# Patient Record
Sex: Female | Born: 1940 | Race: White | Hispanic: No | Smoking: Current every day smoker
Health system: Southern US, Community
[De-identification: ages and names within clinical notes are randomized; demographics above are authoritative.]

## PROBLEM LIST (undated history)

## (undated) DIAGNOSIS — K760 Fatty (change of) liver, not elsewhere classified: Secondary | ICD-10-CM

## (undated) DIAGNOSIS — E7800 Pure hypercholesterolemia, unspecified: Secondary | ICD-10-CM

## (undated) DIAGNOSIS — I714 Abdominal aortic aneurysm, without rupture, unspecified: Secondary | ICD-10-CM

## (undated) DIAGNOSIS — K922 Gastrointestinal hemorrhage, unspecified: Secondary | ICD-10-CM

## (undated) DIAGNOSIS — F32A Depression, unspecified: Secondary | ICD-10-CM

## (undated) DIAGNOSIS — M1990 Unspecified osteoarthritis, unspecified site: Secondary | ICD-10-CM

## (undated) DIAGNOSIS — J449 Chronic obstructive pulmonary disease, unspecified: Secondary | ICD-10-CM

## (undated) DIAGNOSIS — I639 Cerebral infarction, unspecified: Secondary | ICD-10-CM

## (undated) DIAGNOSIS — G463 Brain stem stroke syndrome: Secondary | ICD-10-CM

## (undated) DIAGNOSIS — K649 Unspecified hemorrhoids: Secondary | ICD-10-CM

## (undated) DIAGNOSIS — D649 Anemia, unspecified: Secondary | ICD-10-CM

## (undated) DIAGNOSIS — I10 Essential (primary) hypertension: Secondary | ICD-10-CM

## (undated) DIAGNOSIS — F329 Major depressive disorder, single episode, unspecified: Secondary | ICD-10-CM

## (undated) DIAGNOSIS — K5790 Diverticulosis of intestine, part unspecified, without perforation or abscess without bleeding: Secondary | ICD-10-CM

## (undated) HISTORY — DX: Gastrointestinal hemorrhage, unspecified: K92.2

## (undated) HISTORY — DX: Fatty (change of) liver, not elsewhere classified: K76.0

## (undated) HISTORY — DX: Brain stem stroke syndrome: G46.3

## (undated) HISTORY — PX: CATARACT EXTRACTION, BILATERAL: SHX1313

## (undated) HISTORY — DX: Unspecified hemorrhoids: K64.9

## (undated) HISTORY — DX: Diverticulosis of intestine, part unspecified, without perforation or abscess without bleeding: K57.90

## (undated) HISTORY — DX: Depression, unspecified: F32.A

## (undated) HISTORY — PX: ABDOMINAL HYSTERECTOMY: SHX81

## (undated) HISTORY — DX: Chronic obstructive pulmonary disease, unspecified: J44.9

## (undated) HISTORY — DX: Anemia, unspecified: D64.9

## (undated) HISTORY — DX: Major depressive disorder, single episode, unspecified: F32.9

## (undated) HISTORY — PX: EYE SURGERY: SHX253

## (undated) HISTORY — PX: SHOULDER SURGERY: SHX246

## (undated) HISTORY — PX: KNEE SURGERY: SHX244

## (undated) HISTORY — PX: LAMINECTOMY: SHX219

## (undated) HISTORY — DX: Unspecified osteoarthritis, unspecified site: M19.90

---

## 1998-12-09 ENCOUNTER — Other Ambulatory Visit: Admission: RE | Admit: 1998-12-09 | Discharge: 1998-12-09 | Payer: Self-pay | Admitting: *Deleted

## 1998-12-26 ENCOUNTER — Encounter: Admission: RE | Admit: 1998-12-26 | Discharge: 1998-12-26 | Payer: Self-pay | Admitting: *Deleted

## 1998-12-26 ENCOUNTER — Encounter: Payer: Self-pay | Admitting: *Deleted

## 1999-01-29 ENCOUNTER — Ambulatory Visit (HOSPITAL_COMMUNITY): Admission: RE | Admit: 1999-01-29 | Discharge: 1999-01-29 | Payer: Self-pay | Admitting: *Deleted

## 1999-02-18 ENCOUNTER — Encounter: Payer: Self-pay | Admitting: Otolaryngology

## 1999-02-18 ENCOUNTER — Ambulatory Visit (HOSPITAL_COMMUNITY): Admission: RE | Admit: 1999-02-18 | Discharge: 1999-02-18 | Payer: Self-pay | Admitting: Otolaryngology

## 1999-02-18 ENCOUNTER — Encounter (INDEPENDENT_AMBULATORY_CARE_PROVIDER_SITE_OTHER): Payer: Self-pay | Admitting: Specialist

## 1999-02-21 ENCOUNTER — Emergency Department (HOSPITAL_COMMUNITY): Admission: EM | Admit: 1999-02-21 | Discharge: 1999-02-21 | Payer: Self-pay | Admitting: Emergency Medicine

## 1999-03-03 ENCOUNTER — Ambulatory Visit (HOSPITAL_COMMUNITY): Admission: RE | Admit: 1999-03-03 | Discharge: 1999-03-03 | Payer: Self-pay | Admitting: Cardiology

## 1999-03-06 ENCOUNTER — Inpatient Hospital Stay (HOSPITAL_COMMUNITY): Admission: AD | Admit: 1999-03-06 | Discharge: 1999-03-07 | Payer: Self-pay | Admitting: Cardiology

## 1999-03-11 ENCOUNTER — Ambulatory Visit: Admission: RE | Admit: 1999-03-11 | Discharge: 1999-03-11 | Payer: Self-pay | Admitting: *Deleted

## 1999-04-04 ENCOUNTER — Encounter: Payer: Self-pay | Admitting: Neurology

## 1999-04-04 ENCOUNTER — Encounter: Admission: RE | Admit: 1999-04-04 | Discharge: 1999-04-04 | Payer: Self-pay | Admitting: Psychiatry

## 1999-10-15 ENCOUNTER — Emergency Department (HOSPITAL_COMMUNITY): Admission: EM | Admit: 1999-10-15 | Discharge: 1999-10-15 | Payer: Self-pay | Admitting: Emergency Medicine

## 1999-12-08 ENCOUNTER — Encounter: Payer: Self-pay | Admitting: Cardiology

## 1999-12-08 ENCOUNTER — Ambulatory Visit (HOSPITAL_COMMUNITY): Admission: RE | Admit: 1999-12-08 | Discharge: 1999-12-08 | Payer: Self-pay | Admitting: Cardiology

## 2000-01-02 ENCOUNTER — Encounter: Payer: Self-pay | Admitting: Orthopedic Surgery

## 2000-01-02 ENCOUNTER — Ambulatory Visit (HOSPITAL_COMMUNITY): Admission: RE | Admit: 2000-01-02 | Discharge: 2000-01-02 | Payer: Self-pay | Admitting: Orthopedic Surgery

## 2000-05-17 ENCOUNTER — Encounter: Admission: RE | Admit: 2000-05-17 | Discharge: 2000-08-15 | Payer: Self-pay | Admitting: Anesthesiology

## 2000-09-13 ENCOUNTER — Emergency Department (HOSPITAL_COMMUNITY): Admission: EM | Admit: 2000-09-13 | Discharge: 2000-09-13 | Payer: Self-pay

## 2002-07-11 ENCOUNTER — Encounter: Payer: Self-pay | Admitting: Family Medicine

## 2002-07-11 ENCOUNTER — Ambulatory Visit (HOSPITAL_COMMUNITY): Admission: RE | Admit: 2002-07-11 | Discharge: 2002-07-11 | Payer: Self-pay | Admitting: Family Medicine

## 2002-12-20 ENCOUNTER — Inpatient Hospital Stay (HOSPITAL_COMMUNITY): Admission: EM | Admit: 2002-12-20 | Discharge: 2002-12-29 | Payer: Self-pay

## 2004-08-02 ENCOUNTER — Emergency Department (HOSPITAL_COMMUNITY): Admission: EM | Admit: 2004-08-02 | Discharge: 2004-08-02 | Payer: Self-pay | Admitting: Emergency Medicine

## 2004-09-29 ENCOUNTER — Emergency Department (HOSPITAL_COMMUNITY): Admission: EM | Admit: 2004-09-29 | Discharge: 2004-09-29 | Payer: Self-pay | Admitting: Emergency Medicine

## 2004-12-29 ENCOUNTER — Ambulatory Visit (HOSPITAL_COMMUNITY): Admission: RE | Admit: 2004-12-29 | Discharge: 2004-12-29 | Payer: Self-pay | Admitting: Internal Medicine

## 2005-05-22 ENCOUNTER — Inpatient Hospital Stay (HOSPITAL_COMMUNITY): Admission: EM | Admit: 2005-05-22 | Discharge: 2005-05-25 | Payer: Self-pay | Admitting: Emergency Medicine

## 2005-05-29 ENCOUNTER — Ambulatory Visit: Payer: Self-pay | Admitting: Gastroenterology

## 2005-06-05 ENCOUNTER — Inpatient Hospital Stay (HOSPITAL_COMMUNITY): Admission: EM | Admit: 2005-06-05 | Discharge: 2005-06-08 | Payer: Self-pay | Admitting: Emergency Medicine

## 2005-12-15 ENCOUNTER — Inpatient Hospital Stay (HOSPITAL_COMMUNITY): Admission: EM | Admit: 2005-12-15 | Discharge: 2005-12-18 | Payer: Self-pay | Admitting: Emergency Medicine

## 2005-12-21 ENCOUNTER — Ambulatory Visit: Payer: Self-pay | Admitting: Gastroenterology

## 2005-12-25 ENCOUNTER — Emergency Department (HOSPITAL_COMMUNITY): Admission: EM | Admit: 2005-12-25 | Discharge: 2005-12-25 | Payer: Self-pay | Admitting: Emergency Medicine

## 2006-01-07 ENCOUNTER — Inpatient Hospital Stay (HOSPITAL_COMMUNITY): Admission: EM | Admit: 2006-01-07 | Discharge: 2006-01-11 | Payer: Self-pay | Admitting: Emergency Medicine

## 2006-01-15 ENCOUNTER — Inpatient Hospital Stay (HOSPITAL_COMMUNITY): Admission: EM | Admit: 2006-01-15 | Discharge: 2006-01-20 | Payer: Self-pay | Admitting: Emergency Medicine

## 2006-01-21 ENCOUNTER — Ambulatory Visit (HOSPITAL_COMMUNITY): Admission: RE | Admit: 2006-01-21 | Discharge: 2006-01-22 | Payer: Self-pay | Admitting: Orthopedic Surgery

## 2006-02-19 ENCOUNTER — Encounter: Admission: RE | Admit: 2006-02-19 | Discharge: 2006-02-19 | Payer: Self-pay | Admitting: Orthopedic Surgery

## 2006-03-01 ENCOUNTER — Ambulatory Visit (HOSPITAL_COMMUNITY): Admission: RE | Admit: 2006-03-01 | Discharge: 2006-03-02 | Payer: Self-pay | Admitting: Orthopedic Surgery

## 2006-06-11 ENCOUNTER — Ambulatory Visit (HOSPITAL_COMMUNITY): Admission: RE | Admit: 2006-06-11 | Discharge: 2006-06-11 | Payer: Self-pay | Admitting: Orthopedic Surgery

## 2006-07-12 ENCOUNTER — Ambulatory Visit (HOSPITAL_BASED_OUTPATIENT_CLINIC_OR_DEPARTMENT_OTHER): Admission: RE | Admit: 2006-07-12 | Discharge: 2006-07-13 | Payer: Self-pay | Admitting: Orthopedic Surgery

## 2006-11-22 ENCOUNTER — Emergency Department (HOSPITAL_COMMUNITY): Admission: EM | Admit: 2006-11-22 | Discharge: 2006-11-22 | Payer: Self-pay | Admitting: *Deleted

## 2006-11-27 ENCOUNTER — Inpatient Hospital Stay (HOSPITAL_COMMUNITY): Admission: EM | Admit: 2006-11-27 | Discharge: 2006-11-29 | Payer: Self-pay | Admitting: Emergency Medicine

## 2006-11-29 ENCOUNTER — Ambulatory Visit: Payer: Self-pay | Admitting: *Deleted

## 2006-11-29 ENCOUNTER — Encounter (INDEPENDENT_AMBULATORY_CARE_PROVIDER_SITE_OTHER): Payer: Self-pay | Admitting: Internal Medicine

## 2007-03-22 ENCOUNTER — Ambulatory Visit: Payer: Self-pay | Admitting: Gastroenterology

## 2007-06-01 IMAGING — CT CT 3D INDEPENDENT WKST
3 of 7 series · 10 of 33 positions shown, 12 images · IV contrast (agent unspecified)
Comparison: Plain radiographs 12/25/05.

CLINICAL DATA: Greater tuberosity fracture.   History of shoulder dislocation. 
CT OF THE LEFT WITHOUT CONTRAST:
TECHNIQUE: Multidetector CT imaging was performed according to the standard protocol.  No intravenous contrast was administered.  Multiplanar CT image reconstructions were also generated.

[Series 4: upper ext · axial · 0.47mm/px · z∈[-150,-90]mm · 2 of 72 slices shown, 3 images]
[im 24/72  soft-tissue]
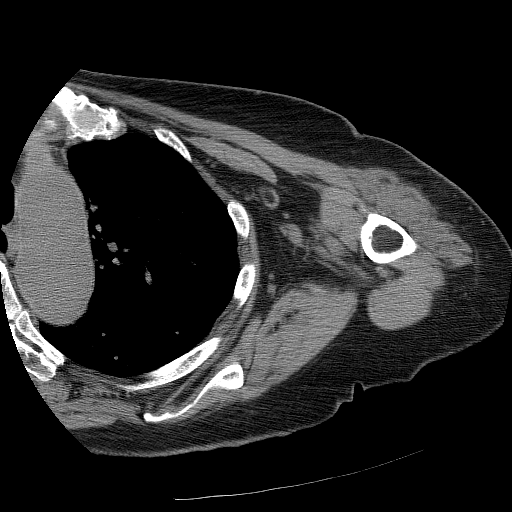
[im 24/72  bone]
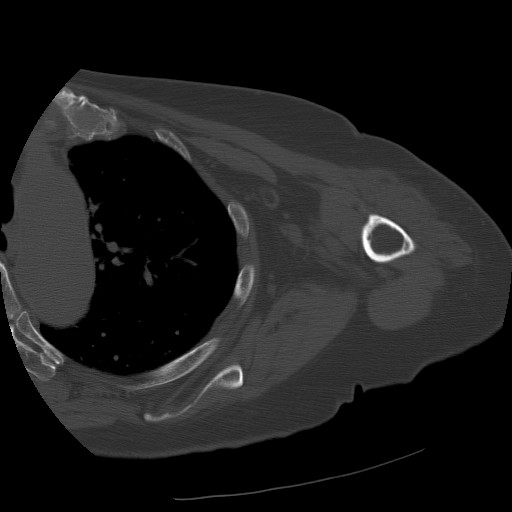
[im 48/72  bone]
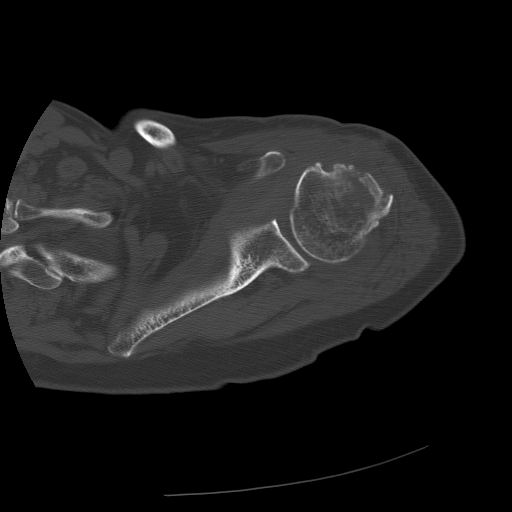

[Series 200: cor lt shoulder · coronal · 0.47mm/px · 5 of 73 slices shown, 6 images]
[im 25/73  bone]
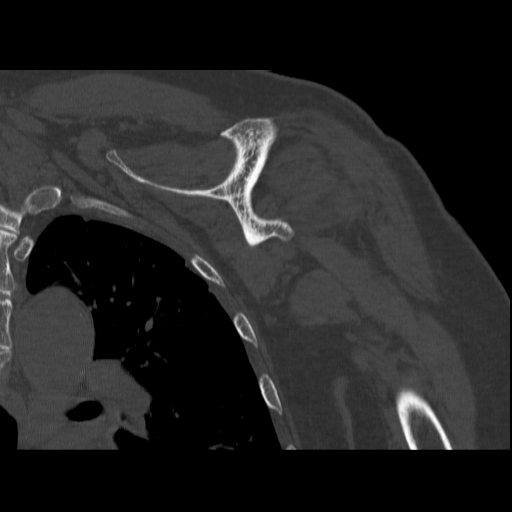
[im 31/73  bone]
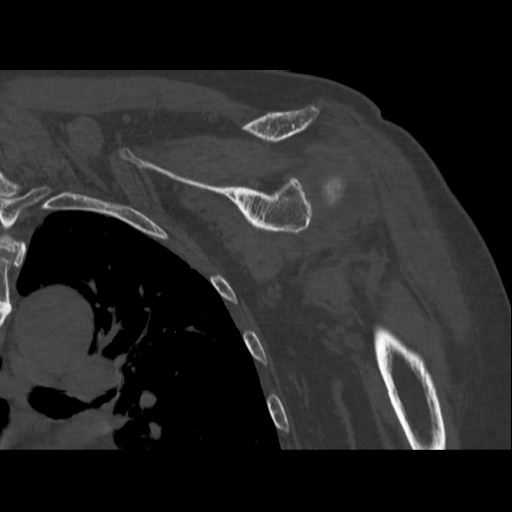
[im 37/73  soft-tissue]
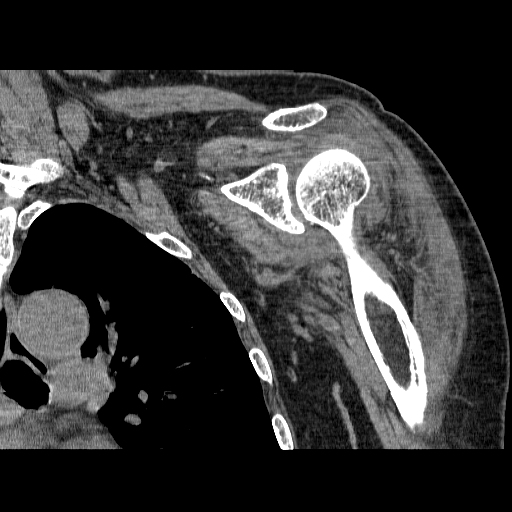
[im 37/73  bone]
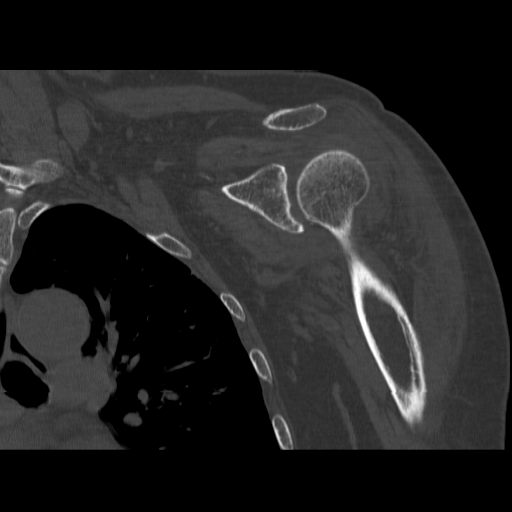
[im 43/73  bone]
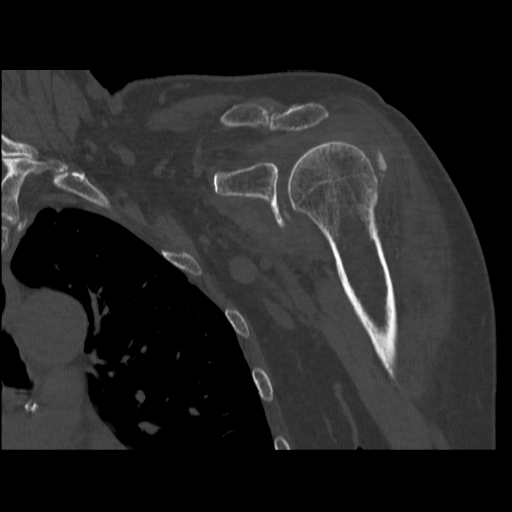
[im 49/73  bone]
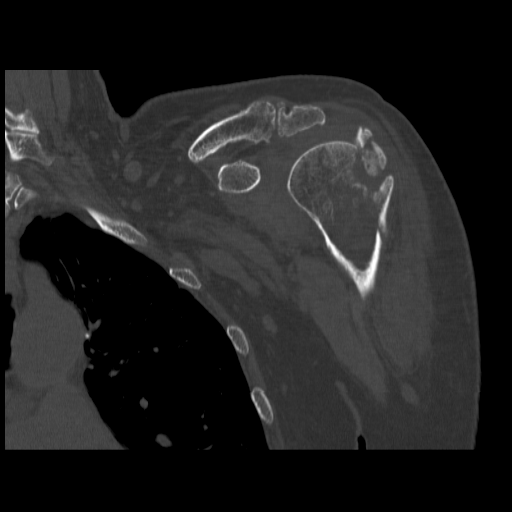

[Series 201: sag lt shoulder · sagittal · 0.47mm/px · 3 of 81 slices shown]
[im 17/81  bone]
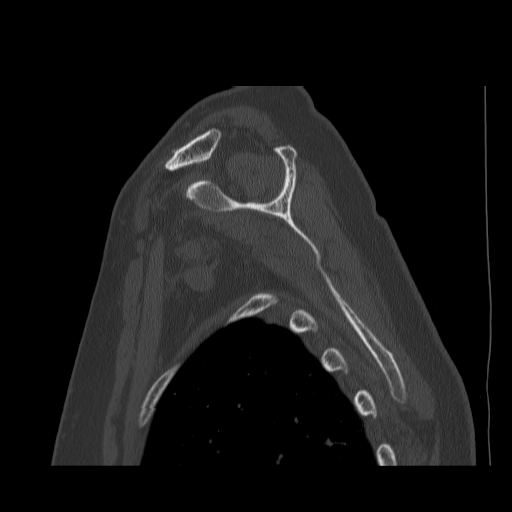
[im 33/81  bone]
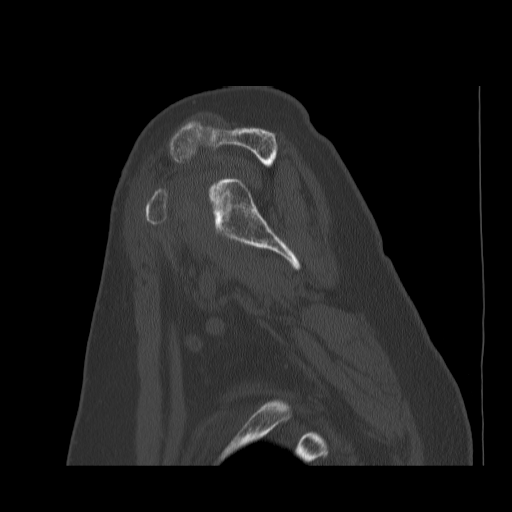
[im 49/81  bone]
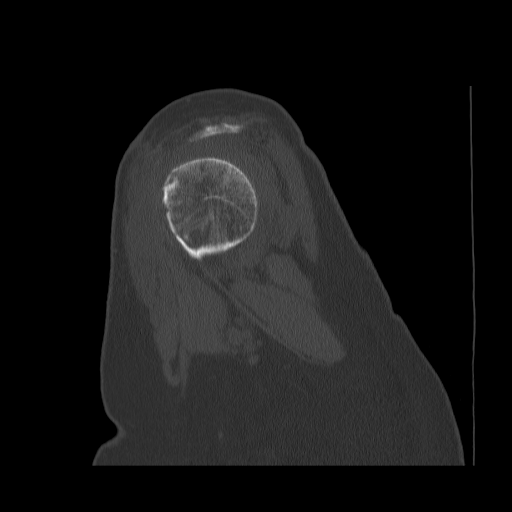

[10 of 33 positions shown; findings below may reference images not displayed]

FINDINGS: The humerus is located.  Patient has a comminuted greater tuberosity fracture.  Largest segment of the greater tuberosity where the rotator cuff inserts is rotated at the attachment site of the rotator cuff and faces directly medial.  Patient?s fracture does not extend into the surgical or anatomical neck or into the lesser tuberosity.  Greater tuberosity fragments are displaced approximately 1 cm posteriorly with the rotated fragment of the greater tuberosity displaced approximately 1.4 cm superiorly.  The acromioclavicular joint is intact.  The coracoid process is intact.  No bony Bankart lesion is identified.  There is some calcification along the inferior aspect of the glenoid bone likely within the articular cartilage or labrum and due to degenerative disease or chondrocalcinosis.  Although evaluation is limited, there is no gross abnormality of the supraspinatus, infraspinatus, or subscapular tendons such as tear.  Labrum cannot be adequately assessed with CT.  Imaged lung is clear.
IMPRESSION: Comminuted greater tuberosity fracture with the rotator cuff insertion rotated superiorly facing directly medial as above.  Findings are compatible with a two part shoulder fracture under the Neer classification system.

## 2007-06-27 ENCOUNTER — Other Ambulatory Visit: Admission: RE | Admit: 2007-06-27 | Discharge: 2007-06-27 | Payer: Self-pay | Admitting: Internal Medicine

## 2007-07-26 ENCOUNTER — Emergency Department (HOSPITAL_COMMUNITY): Admission: EM | Admit: 2007-07-26 | Discharge: 2007-07-27 | Payer: Self-pay | Admitting: Emergency Medicine

## 2008-11-04 IMAGING — CR DG FOREARM 2V*R*
2 series · 2 of 2 positions shown · non-contrast
Comparison: Elbow series

CLINICAL DATA: Fall, elbow and forearm pain

RIGHT FOREARM - 2 VIEW

[x forearm ap right]
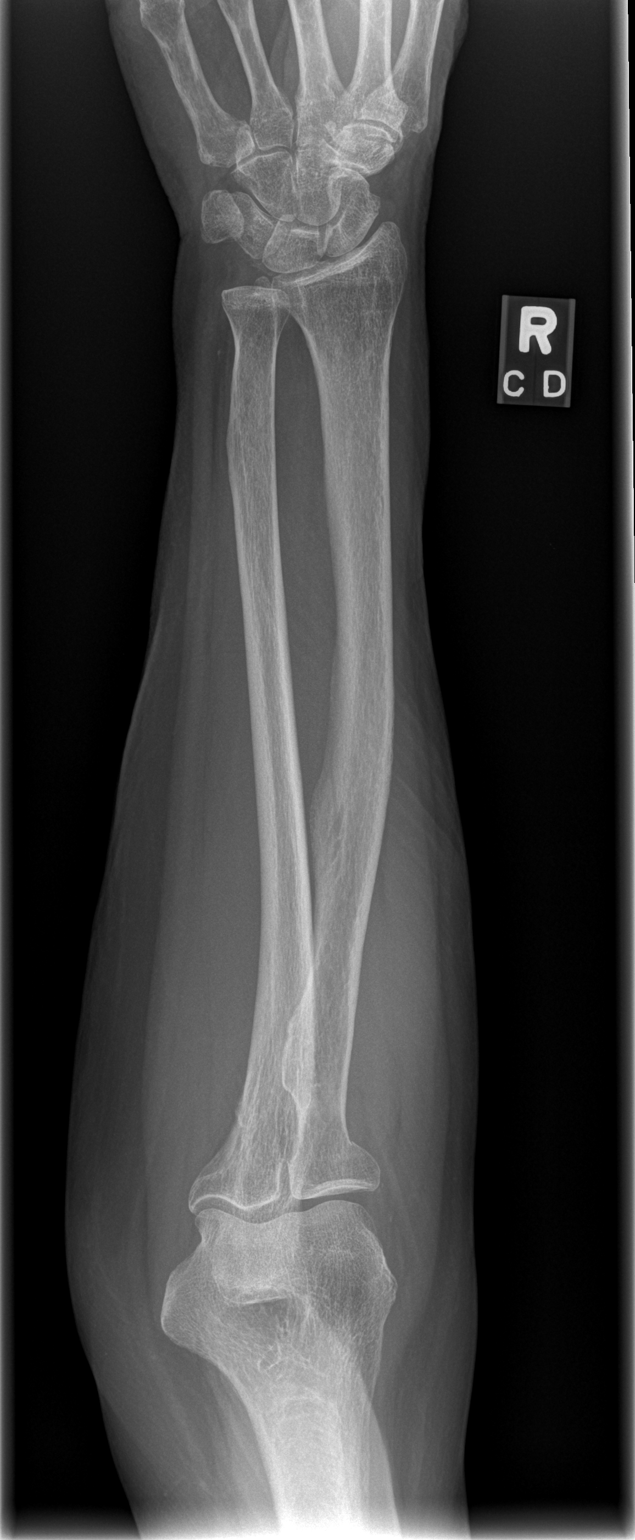

[x forearm lat right]
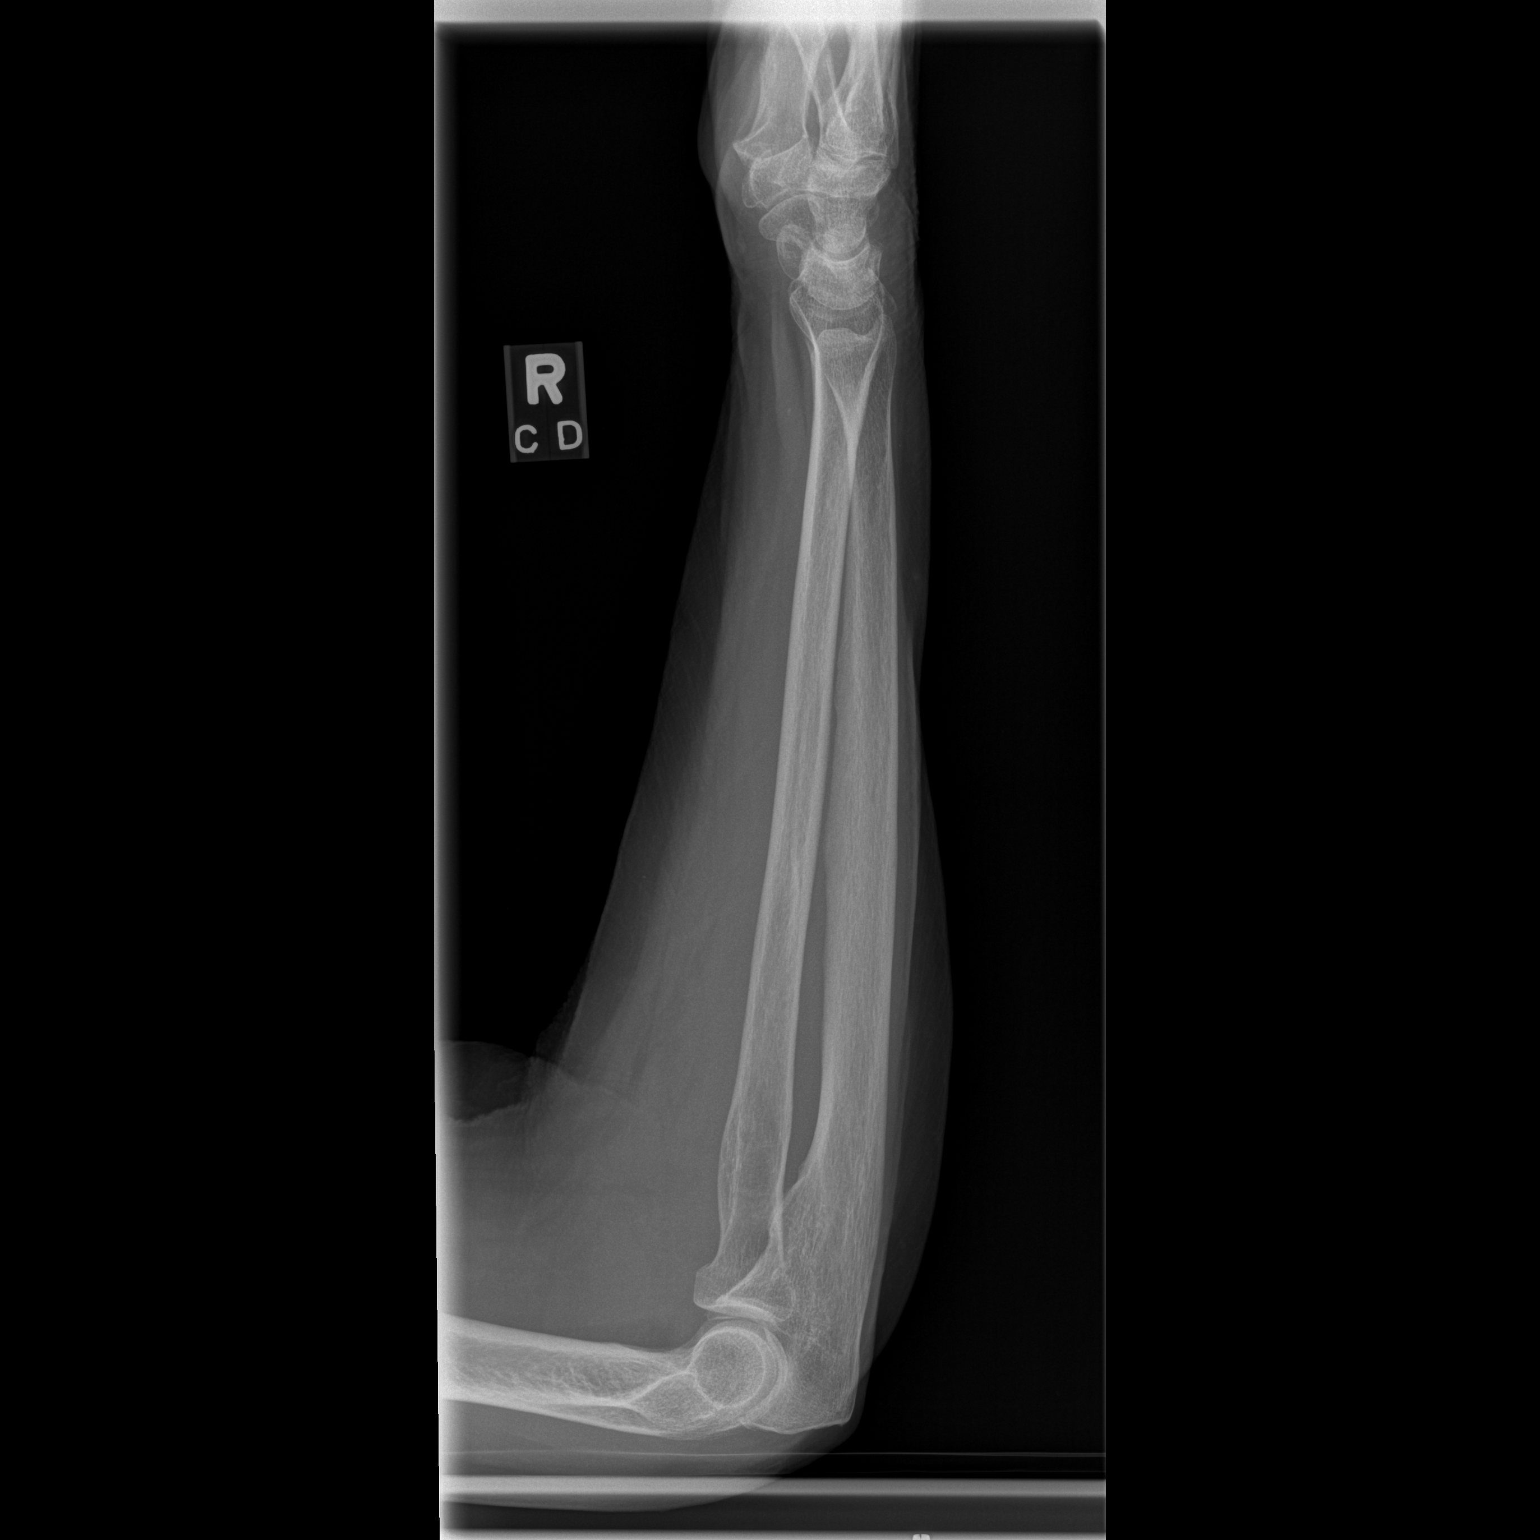

[2 of 2 positions shown; findings below may reference images not displayed]

FINDINGS: Right radial neck fracture again seen.  No additional
acute bony abnormality.  Soft tissues are intact.
IMPRESSION: Right radial neck fracture.

## 2009-08-30 ENCOUNTER — Encounter: Admission: RE | Admit: 2009-08-30 | Discharge: 2009-08-30 | Payer: Self-pay | Admitting: Internal Medicine

## 2009-08-30 DIAGNOSIS — K760 Fatty (change of) liver, not elsewhere classified: Secondary | ICD-10-CM

## 2009-08-30 HISTORY — DX: Fatty (change of) liver, not elsewhere classified: K76.0

## 2010-03-05 ENCOUNTER — Other Ambulatory Visit: Payer: Self-pay | Admitting: Internal Medicine

## 2010-03-05 DIAGNOSIS — I729 Aneurysm of unspecified site: Secondary | ICD-10-CM

## 2010-03-10 ENCOUNTER — Ambulatory Visit
Admission: RE | Admit: 2010-03-10 | Discharge: 2010-03-10 | Disposition: A | Discharge status: Home / Self Care | Payer: MEDICARE | Source: Ambulatory Visit | Attending: Internal Medicine | Admitting: Internal Medicine

## 2010-03-10 DIAGNOSIS — I729 Aneurysm of unspecified site: Secondary | ICD-10-CM

## 2010-06-03 NOTE — Discharge Summary (Signed)
NAME:  Amy Moss, BIAS NO.:  [id number]   MEDICAL RECORD NO.:  [id number]          PATIENT TYPE:  INP   LOCATION:  4703                         FACILITY:  [facility]   PHYSICIAN:  Lonia Blood, M.D.       DATE OF BIRTH:  04/30/1940   DATE OF ADMISSION:  11/27/2006  DATE OF DISCHARGE:  11/29/2006                               DISCHARGE SUMMARY   PATIENT'S PRIMARY CARE PHYSICIAN:  Dr. Marisue Brooklyn.   DISCHARGE DIAGNOSES:  1. Syncope - unclear etiology - no recurrence.  2. Right carotid artery stenosis 60-80%.  3. Hypertension.  4. Tobacco abuse.  5. History of diverticular bleed.  6. Anxiety and depression.  7. Orthostasis on admission - resolved.   DISCHARGE MEDICATIONS:  1. Cymbalta 120 mg daily.  2. Enalapril 10 mg twice a day  3. Lopressor 25 mg twice a day.  4. Value 10 mg 3 times a day.  5. Aspirin 81 mg daily.  6. Nicotine patch 21 mg daily.  7. Prilosec OTC 20 mg daily.   CONDITION ON DISCHARGE:  [name] was discharged in good condition.  At  the time of discharge, the patient had no neurological deficits.  The  patient was instructed to follow up with her primary care physician as  needed.  The patient was set up for a vascular surgery opinion with Dr.  Hart Rochester on December 07, 2006 at 9:30 a.m.   CONSULTATION:  This admission - no consultations obtained.   PROCEDURES:  1. October 29, 2006, transthoracic echocardiogram.  Findings of      preserved ejection fraction.  2. November 29, 2006, MRI of the brain, findings negative for acute      stroke.  3. October 29, 2006, carotid Dopplers, findings of right 60-80% ICA      stenosis.  4. October 29, 2006, EEG, results pending.   HISTORY AND PHYSICAL:  For admission history and physical, please refer  to the dictated H&P done by Dr. Della Goo on November 27, 2006.   HOSPITAL COURSE:  1. Syncopal event.  [name] was admitted with an episode of loss of      consciousness.  The patient was  observed on telemetry for 48 hours      without any alarms.  An MRI of the brain did not indicate an acute      stroke.  A transthoracic echocardiogram showed a preserved ejection      fraction and no regional wall motion abnormalities.  Three sets of      cardiac enzymes were within normal limits.  It seemed that Ms.      Crowl' episode could have been related to orthostasis.  In the      emergency room, the patient was initially hypotensive, but then she      had quickly started running elevated blood pressure levels      throughout this hospitalization.  The maximum was 190/100.  2. Asymptomatic Right ICA stenosis. The workup for syncope revealed      presence of carotid artery stenosis which I am not sure  if it is      related to the patient's syncopal event.  I have recommended close      outpatient follow-up of the carotid stenosis for consideration of      possible repair. A vascular surgery consult appointment has been      scheduled. For now, Ms. Sandles' should continue aspirin as well as      her blood pressure medications as before.  3. Anxiety. It became apparent that the patient has abruptly      discontinued her Cymbalta 1 week prior to admission, which could      have contributed to her symptoms. She was advised to continue      Cymbalta and Valium without interruptions until she sees her pimary      care MD.      Lonia Blood, M.D.  Electronically Signed     SL/MEDQ  D:  11/29/2006  T:  11/30/2006  Job:  [id number]   cc:   Lovenia Kim, D.O.

## 2010-06-03 NOTE — H&P (Signed)
NAME:  Amy Moss, Amy Moss NO.:  [id number]   MEDICAL RECORD NO.:  [id number]          PATIENT TYPE:  INP   LOCATION:  4703                         FACILITY:  [facility]   PHYSICIAN:  Della Goo, M.D. DATE OF BIRTH:  13-Oct-1940   DATE OF ADMISSION:  11/27/2006  DATE OF DISCHARGE:                              HISTORY & PHYSICAL   PRIMARY CARE PHYSICIAN:  Dr. Marisue Brooklyn   CHIEF COMPLAINT:  Passed out.   HISTORY OF PRESENT ILLNESS:  This is a 70 year old female who was  brought to the emergency department emergently after an episode of  passing out which was witnessed by her daughters and reported as lasting  approximately 8 minutes.  The patient was at the dinner table and after  finishing dinner passed out.  The patient denies having any headache or  chest pain or dizziness prior to the incident.  She does report having a  headache after.  She also reports having similar episodes which occurred  years ago after she suffered a mild cerebrovascular accident.  She has a  history of a cerebrovascular accident of the brain stem in 2000 with  subsequent syncopal episodes.  She denies having any nausea, vomiting,  chest pain, shortness of breath.  Does report having generalized  weakness.  The patient also reports suffering urinary incontinence after  the episode.   PAST MEDICAL HISTORY:  1. Hypertension.  2. Syncope.  3. Anxiety.  4. Depression.   PAST SURGICAL HISTORY:  1. Previous back surgeries.  2. Knee surgeries.  3. Shoulder surgeries.  4. Left arm surgery.   MEDICATIONS:  1. Lopressor.  2. Cymbalta.  3. Diazepam.  4. Enalapril.   MEDICATIONS:  Need to be verified.   ALLERGIES:  NO KNOWN DRUG ALLERGIES.   SOCIAL HISTORY:  Positive tobacco, one pack per day for 20 years.  Negative alcohol.   FAMILY HISTORY:  Noncontributory.   PHYSICAL EXAMINATION:  GENERAL:  This is a 70 year old well-nourished,  well-developed female in no acute distress.  VITAL SIGNS:  Temperature 97.1, blood pressure 125/74, heart rate 69,  respirations 18, O2 saturations 95%.  HEENT:  Examination normocephalic, atraumatic.  Pupils equally round  reactive to light.  Extraocular muscles are intact.  Funduscopic benign.  Oropharynx is clear.  NECK:  Supple, full range of motion.  No thyromegaly, adenopathy,  jugulovenous distention.  CARDIOVASCULAR:  Regular rate and rhythm.  No murmurs, gallops, rubs.  LUNGS: Clear to auscultation bilaterally.  ABDOMEN:  Positive bowel sounds, soft, nontender, nondistended.  EXTREMITIES: Without cyanosis, clubbing or edema.  NEUROLOGIC:  The patient is alert and oriented at this time.  There are  no focal deficits on examination.   LABORATORY STUDIES:  White blood cell count 8.6, hemoglobin 13.6,  hematocrit 39.4, platelets 240, neutrophils 76%.  Sodium 136, potassium  4.8, chloride 100, bicarb 29, BUN 19, creatinine 1.66 and glucose 94.  CT scan of the head negative for acute findings.   ASSESSMENT:  A 70 year old female being admitted with:  1. Syncopal episode.  2. Orthostatic hypotension.  3. History of hypertension.  4. Anxiety/depression.  5. Tobacco history.   PLAN:  The patient will be admitted to telemetry area and cardiac  enzymes have been ordered.  The patient will also be placed on  neurologic checks.  An MRI, MRA study has been ordered along with  carotid ultrasound and 2-D echo.  DVT and GI prophylaxis have been  ordered along with aspirin therapy.  Further workup will ensue pending  results of her studies.      Della Goo, M.D.  Electronically Signed     HJ/MEDQ  D:  11/28/2006  T:  11/29/2006  Job:  [id number]   cc:   Lovenia Kim, D.O.

## 2010-06-03 NOTE — Op Note (Signed)
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   ATTENDING SURGEON:  Dr. Bradly Bienenstock who was scrubbed and present for  the entire procedure.   ASSISTANT SURGEON:  None.   ANESTHESIA:  General via LMA.   SURGICAL PROCEDURES:  Left elbow ulnar nerve decompression and  submuscular transposition.   TOURNIQUET TIME:  48 minutes at 250 mmHg.   ESTIMATED BLOOD LOSS:  Less than 100 mL.   INDICATIONS FOR PROCEDURE:  Amy Moss is a 66-year all female who has  had a history of left elbow pain as well as inability to use the left  hand.  The patient underwent thorough preoperative evaluation and was  noted to have ulnar nerve compression at the level of the elbow.  She  had positive nerve conduction and EMG studies.  The patient's  examination was consistent with a high ulnar nerve lesion with the  flexion of the ring and small fingers as well as intrinsic atrophy of  the hand.  The risks were explained to her to include but not limited to  bleeding, infection, nerve damage, persistent symptoms, decreased use of  the hand, elbow stiffness, lack of relief of symptoms and need for  further surgery to include also infection and damage to nearby nerves,  arteries or tendons and ligaments.  A signed informed consent was  obtained on the day of surgery.   INTRAOPERATIVE FINDINGS:  The patient did have what appeared to be  compression around the level of the cubital  tunnel at its entrance and  exit.  There are no other soft tissue masses or irregularities within  the floor of the cubital tunnel.   DESCRIPTION OF PROCEDURE:  The patient was properly identified in the  preoperative holding area and mark with a permanent marker was made on  the left elbow to indicate the correct operative site.  The patient then  brought back to the operating room, placed supine on the anesthesia room  table where general anesthesia was administered.  The patient tolerated  the procedure well.  The patient received preoperative antibiotics prior  to skin incisions, Ancef.  The left upper extremity was then prepped and  draped and with a Hibiclens and alcohol.  A time-out was called.  The  correct site was identified and the surgical procedure was then begun.  Skin incisions were then marked out over the region centered over the  cubital tunnel.  This was 10 cm long between the medial epicondyle and  the olecranon process.  The limb was then elevated and  the tourniquet  insufflated to 250 mmHg mercury.  Skin incision was then carried down  through the skin and subcutaneous tissues.  Terminal branches of the  median and cutaneous nerve were then identified and then isolated with a  small Vesseloop and protected throughout to aid in retraction.  The  ulnar nerve was then identified proximal to the level of the cubital  tunnel.  The aponeurosis was then incised and the cubital some  retinaculum was then released.  Further dissection was then carried up  proximally and release of the elbow was then done.  Portions of the  intermuscular septum were then released as well as the arcade of  Struthers at the medial head of the triceps.  After full release  proximally, attention was then turned distally.  The ulnar nerve was  then traced distally.  The cubital tunnel and retinaculum was then  released.  The flexor carpi ulnaris aponeurosis was then released.  The  small articular  branch of the FCU was then identified and was released  to aid in transposition.  The motor branches to the FCU and the FDP were  preserved and mobilized to permit anterior transposition.  The attempt  was made throughout the case to maintain the longitudinal extrinsic  vascular supply to the ulnar nerve.  After the decompression was done  both proximally and distally, the lateral skin flap was then elevated to  expose the lacertus fibrosis.  The cutaneous nerves were preserved.  The  median nerve was then identified and a small Vesseloop was then applied  around the median nerve to protect it  throughout.  A large tonsillar  hemostat was then inserted distal to the epicondyle from the lateral  side deep to the flexor pronator muscles avoiding the vasculature.  The  flexor pronator group was then severed proximally 1.5 cm distal to the  medial epicondyle.  The muscle was released first laterally and the  hemostat was then repassed to obtain adequate placement.  The bipolar  cautery was used for the muscle bleeders.  Using a periosteal elevator,  the flexor pronator muscles were then stripped distally.  Motor branches  to the flexor pronator mass were preserved.  The tourniquet had been  released prior to release of the flexor pronator mass.  The thorough  irrigation of the field was then completed.  The field was then dried.  Once adequate hemostasis was then obtained, the flexor pronator muscles  were then repaired in a slightly elongated fashion with a 3-0 Ethilon  suture.  The nerve was lying and running parallel with the median nerve.  After repair of the flexor pronator muscles, the deep subcutaneous  tissues were then closed with 2-0 Vicryl.  The subcutaneous tissues were  closed with 2-0 Vicryl and the skin closed with a running horizontal  mattress 4-0 nylon supplemented with several 4-0 nylon simple sutures. Following the submuscular transposition of the ulnar nerve, the elbow   was flexed and extended passively and there did not appear to be sites  of compression and there did not appear to be any evidence of  subluxation or dislocation of the medial triceps.  I used 20 mL of 25%  Marcaine to inject locally in the field.  Adaptic dressing was then  applied.  All wounds were thoroughly irrigated prior to each layer of  closure.  A sterile compressive dressing was then applied.  The patient  was then placed into a well-padded posterior elbow splint.  The  patient  was then extubated and taken to the recovery room in good condition.   POSTOPERATIVE PLAN:  The patient being admitted for postoperative pain  control and IV antibiotics.  She will continue with the splint until the  sutures are removed.  We will then get her down into therapy and begin  some gentle mobilization of the elbow.  The therapy will be used to help  strengthen and mobilize the arm.  We will likely need to get her into a  splint to help decrease her clawing of her ulnar two digits.  Likely, no  return to work for at least 3 months using a left arm.      Madelynn Done, MD  Electronically Signed     FWO/MEDQ  D:  07/12/2006  T:  07/12/2006  Job:  [id number]

## 2010-06-03 NOTE — Procedures (Signed)
EEG NUMBER:  [id number].   HISTORY:  This is a 70 year old with syncope who is having EEG done to  evaluate for seizures.   PROCEDURE:  This routine EEG.   TECHNICAL DESCRIPTION:  Throughout this routine EEG there is a posterior  dominant rhythm of 9 Hz activity at 30-40 microvolts.  The background  activity is symmetric mostly comprised of alpha range activity at 25-40  microvolts.  With photic stimulation there is mild symmetric photic  driving response noted.  Hyperventilation does not produce any  significant abnormalities.  The patient does not enter stage II sleep.  Throughout this record there is no evidence of electrographic seizures  or interictal discharge activity.  EKG  tracing shows a heart rate of 68  beats per minute.   IMPRESSION:  This routine EEG is within normal limits in the awake and  sleep states.      Bevelyn Buckles. Nash Shearer, M.D.  Electronically Signed     [id number]  D:  11/29/2006 12:31:12  T:  11/29/2006 19:53:25  Job #:  [id number]

## 2010-06-06 NOTE — H&P (Signed)
NAME:  Amy Moss, Amy Moss NO.:  [id number]   MEDICAL RECORD NO.:  [id number]          PATIENT TYPE:  INP   LOCATION:  3038                         FACILITY:  [facility]   PHYSICIAN:  Della Goo, M.D. DATE OF BIRTH:  March 13, 1940   DATE OF ADMISSION:  01/15/2006  DATE OF DISCHARGE:                              HISTORY & PHYSICAL   PRIMARY CARE PHYSICIAN:  Dr. Marisue Brooklyn.   CHIEF COMPLAINT:  Increased weakness.   HISTORY OF PRESENT ILLNESS:  This is a 70 year old female who returned  to the emergency department secondary to complaints of severe weakness,  continued cough productive of green phlegm and decreased p.o. intake.  The patient had worsening weakness over the past 3 days since her  discharge from the hospital on December25, 2007.  She also had decreased  p.o. intake of foods and fluids.  The patient reports having chills,  denies having fevers.  The patient was unable to be cared for at home at  this point.  The patient was hospitalized from [place] through  [place], 2007 for a left lower lobe pneumonia and was placed on  antibiotic therapy to continue for 4 days following discharge with  Avelox p.o.  Per report of the patient's daughter, the patient lost the  antibiotic and only found the antibiotic 1 day ago.   PAST MEDICAL HISTORY:  1. Chronic fracture of the left shoulder with left shoulder pain.  2. Diverticulosis.  3. Internal hemorrhoids.  4. Hypertension.  5. Previous cerebrovascular accident.   PAST SURGICAL HISTORY:  1. Status post total abdominal hysterectomy.  2. Status post multiple lumbar laminectomies.   MEDICATIONS ON DISCHARGE:  1. Included enalapril 5 mg one p.o. b.i.d.  2. Valium 10 mg one p.o. t.i.d.  3. Avelox 400 mg one p.o. daily for 4 days.  4. Robaxin p.r.n.  5. Percocet p.r.n. pain.  6. Cymbalta 60 mg one p.o. daily.   ALLERGIES:  No known drug allergies.   SOCIAL HISTORY:  The patient lives at home with her  family.  Previous  smoker prior to hospitalization January 07, 2006 when she had been  smoking one-half to one pack per day for 10 years.  No history of  alcohol usage.   FAMILY HISTORY:  Positive for coronary artery disease in one sister,  diabetes in two sisters.  Father had a cerebrovascular accident.  One  sister with breast cancer and her mother had all Alzheimer's disease.   REVIEW OF SYSTEMS:  Pertinent information as mentioned above.   PHYSICAL EXAMINATION:  GENERAL:  This is a 70 year old older than stated  age appearing well-nourished, well-developed female in no acute distress  currently.  Initially, she had been hypotensive and mildly hypothermic.  Temperature initially had been 96.9, blood pressure 87/56, heart rate  73, respirations 20, O2 saturations 96-100%.  After IV fluids had been  given, the patient's blood pressure did improve to 129/71, heart rate 94  and respirations 20.  HEENT:  Normocephalic, atraumatic.  Pupils equally round and reactive to  light.  Extraocular muscles are intact.  Oropharynx:  The patient  is  edentulous, dentures present.  Oropharynx is clear.  NECK:  Supple full range of motion.  No thyromegaly, adenopathy, jugular  venous distension.  CARDIOVASCULAR:  Regular rate and rhythm.  LUNGS:  Clear to auscultation bilaterally.  ABDOMEN:  Positive bowel sounds, soft, nontender.  EXTREMITIES:  Without edema.  RECTAL:  Deferred.  GENITOURINARY:  Deferred.  NEUROLOGIC EXAMINATION:  The patient is alert and oriented x3.  There  are no sensory or motor deficits.  There is mild generalized weakness.   LABORATORY STUDIES:  Reveal a white blood cell count of 8, hemoglobin  10.7, hematocrit 31.3, platelets 473, neutrophils 61%, lymphocytes 22%.  Sodium 133, potassium 4.1, chloride 103, bicarb 27.6, BUN 19, creatinine  1.6, glucose 73.  Urine negative.  Urinalysis negative.  Chest x-ray  reveals no interval change in the left basilar infiltrate.    ASSESSMENT:  A 70 year old female with worsening of her left lower lobe  pneumonia clinically being admitted with  1. Left lower lobe pneumonia.  2. Hypotension.  3. Dehydration.  4. Normocytic anemia.  5. Mild dehydration.   PLAN:  The patient will be admitted and placed on IV antibiotic therapy,  Rocephin and azithromycin.  She will also receive IV fluids for gentle  rehydration.  She will continue on her regular medications except hold  parameters have been written for blood pressure medications.  She will  be placed on nebulizer treatments of Xopenex q.6h. and q.2h. p.r.n..  A  CT scan has also been ordered of the chest to evaluate for mass due to  the patient's smoking history and continued pneumonia.      Della Goo, M.D.  Electronically Signed     HJ/MEDQ  D:  01/15/2006  T:  01/16/2006  Job:  [id number]   cc:   Lovenia Kim, D.O.

## 2010-06-06 NOTE — Discharge Summary (Signed)
[facility]  Patient:    Amy Moss, Amy Moss                       MRN: [id number] Adm. Date:  [id number] Disc. Date: [id number] Attending:  Armanda Magic Dictator:   Mancel Bale, P.A. CC:         Armanda Magic, M.D.             [name], M.D.                           Discharge Summary  ADMISSION DIAGNOSES: 1. Hypertension. 2. History of syncopal episodes.  DISCHARGE DIAGNOSES: 1. Hypertension. 2. History of syncopal episodes. 3. Status post positive tilt table test.  HISTORY OF PRESENT ILLNESS AND HOSPITAL COURSE:  Amy Moss is a very pleasant 70 year old white female, patient of Dr. Theda Belfast, who Dr. Mayford Knife initially saw in consultation on February 27, 1999, for syncopal episodes.  She had been having problems with dizziness and lightheadedness since approximately eight months ago, and had multiple syncopal episodes while standing up.  She had no prodromal symptoms of the room getting dark.  She denies any nausea, palpitations, or funny swimmy feelings in her head at the time.  She just has a syncopal episode and drops to the ground.  One of those was witnessed, and she stated that the witness said she was out for two minutes.  There was no seizure activity noted.  She also presented to the hospital with a syncopal episode and reportedly had a systolic  pressure that was palpated at 70 mmHg.  It was felt that she may have orthostatic hypotension.  She then presented on March 06, 1999, for further workup because of her recurrent syncope occurring several times a week with severe dizziness occurring on a daily basis.  She was seen by Dr. Mayford Knife in the office on March 06, 1999.  Dr. Mayford Knife then  planned to admit her to telemetry to monitor her for cardiac arrhythmias.  She lso planned for tilt table testing on Friday.  It was felt if these were to be negative, then Dr. Mayford Knife would recommend neurologic consult.  On  March 07, 1999, on that morning [name] continued to complain of presyncope when standing.  At that time she was afebrile, had a blood pressure of 140-150/80-90 and heart rate 80-100.  Telemetry had revealed normal sinus rhythm with sinus tachycardia at 102 beats per minute.  Lungs were clear.  Heart was in regular rhythm.  She had undergone a negative 2-D echocardiogram and a negative  Holter monitor previously.  It was felt that she was probably experiencing orthostatic hypotension as all episodes had occurred while standing.  She was scheduled for tilt table test later this day.  She had had no arrhythmia on monitor.  Her tilt table test was positive for orthostatic hypotension per Dr. Mayford Knife. At that point, Dr. Mayford Knife planned to schedule to start her on Florinef 0.1 mg q.d. We have also planned TEDD hose stockings.  She will follow up with Dr. Mayford Knife on Monday, and have no driving until Sunday or Monday.  CONSULTATIONS:  None.  PROCEDURES:  Tilt table test on March 07, 1999, which was positive.  LABORATORY DATA:  BMP revealed sodium 134, potassium 3.8, glucose 92, BUN 16, creatinine 1.0.  CBC revealed WBC 10.6, hemoglobin 14.6, hematocrit 39.9, and platelets 284.  DISCHARGE MEDICATIONS: 1. Florinef 0.1 mg  once a day. 2. Pamelor 25 mg at night. 3. Premarin 1.25 mg once a day. 4. Valium 5 mg three times a day as needed.  DISCHARGE INSTRUCTIONS: 1. No driving until you see Dr. Mayford Knife, until Sunday or Monday. 2. Use your TEDD hose by putting them on first thing in the morning, and taking    them off before going to bed at night. 3. Have blood draw to check an a.m. cortisol level on this upcoming Monday morning. 4. Follow up with Dr. Mayford Knife in our [facility] office on Monday, March 10, 1999,    at 10 a.m. after having blood drawn. DD:  03/07/99 TD:  03/08/99 Job: [id number]

## 2010-06-06 NOTE — Discharge Summary (Signed)
NAME:  Amy Moss, Amy Moss NO.:  [id number]   MEDICAL RECORD NO.:  [id number]          PATIENT TYPE:  INP   LOCATION:  1418                         FACILITY:  [facility]   PHYSICIAN:  Elliot Cousin, M.D.    DATE OF BIRTH:  1940/07/26   DATE OF ADMISSION:  06/05/2005  DATE OF DISCHARGE:  06/08/2005                                 DISCHARGE SUMMARY   DISCHARGE DIAGNOSES:  1.  Acute lower gastrointestinal bleed secondary to extensive      diverticulosis.      1.  Colonoscopy as performed by Dr. Lina Sar on Jun 06, 2005,          revealed extensive diverticulosis of the left and right colon, no          obstruction, some diverticula with impacted old blood, and thin          reddish stool throughout the colon, no bright red blood.  2.  History of prior diverticular bleeds in the past numbering between 4 and      5.  The patient had a lower gastrointestinal bleed with a near syncopal      episode in December2004.  At that time, she underwent a colonoscopy by      Dr. Leone Payor which revealed internal and external hemorrhoids along with      diverticulosis.  3.  Recent hospitalization at [facility] from ZOX0,9604, through      VWU9,8119, for diverticular bleeding.  During hospital course, a tagged      red cell scan was ordered and determined to be negative.  Also during      hospitalization, consideration was given to an elective subtotal      colectomy (this was just a consideration).  The patient was also      transfused multiple units of packed red blood cells during the      hospitalization.  4.  Chronic anemia secondary to acute and chronic blood loss.  5.  Hypertension.  6.  Depression.  7.  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  8.  Status post hysterectomy in the past.  9.  Status post multiple lumbar laminectomies and diskectomies in the past.  10. History of multiple urinary tract infections  in the past.   DISCHARGE MEDICATIONS:  1.  Protonix 40 mg daily.  2.  Cymbalta 60 mg 2 tablets each morning.  3.  Valium 10 mg t.i.d.  4.  Vasotec 10 mg 1/2 tablet b.i.d.  5.  Lopressor 25 mg 1/2 tablet b.i.d.  6.  Metamucil take as directed daily.  7.  Colace 100 mg b.i.d.  8.  Nu-Iron 150 mg daily.  9  Multivitamin with iron 1 tablet daily   DISCHARGE DISPOSITION:  The patient was discharged home in improved and  stable condition.  She was advised to follow up with her primary care  physician, Dr. Elisabeth Most in one week.   CONSULTATIONS:  Lina Sar, M.D. [facility]   PROCEDURE PERFORMED:  Colonoscopy on JYN82,9562, results as  above.   HISTORY OF PRESENT ILLNESS:  The patient is a 70 year old lady with a past  medical history significant for diverticulosis and diverticular bleeding,  hypertension, and depression, who was recently admitted and treated at  [facility] during the time period of NWG9,5621, through HYQ6,5784,  for rectal bleeding.  The bleeding had stopped. However, on the day of  hospital admission, ONG29,5284, the rectal bleeding recurred.  The patient,  therefore, presented to the emergency department for further evaluation and  management.  When she was evaluated in the emergency department, her  hemoglobin was 10.4.  She was hemodynamically stable although her heart rate  was mildly elevated at 109.  She was, therefore, readmitted for further  evaluation and management.  For additional details, please see the dictated  history and physical by Dr. Michaelyn Barter.   HOSPITAL COURSE:  1.  RECURRENT DIVERTICULAR BLEEDING/EXTENSIVE DIVERTICULOSIS/BLOOD LOSS      ANEMIA.  The patient was started on gentle IV fluids with normal saline.      She was monitored very closely hemodynamically.  Her hemoglobin and      hematocrit were also monitored closely.  She was started on empiric      Protonix 40 mg daily.  Gastroenterologist Dr. Juanda Chance was subsequently       consulted.  Dr. Juanda Chance evaluated the patient and felt that the patient      needed a repeat colonoscopy to rule out ischemic colitis and other      abnormalities such as AVMs and carcinoma.  The patient underwent the      colonoscopy on XLK44,0102, and the results were significant for      extensive diverticulosis, no active bleeding, and thin, reddish stool      throughout the colon (no bright red blood).  Dr. Juanda Chance recommended      starting Metamucil on a daily basis as well as continued iron therapy.      At the time of hospital discharge, the patient was sent home with      instructions to start Metamucil on a daily basis.  She was given a      prescription for Nu-Iron 150 mg daily and she was also encouraged to      take one multivitamin with iron daily.  Over the course of the      hospitalization, the patient's hemoglobin drifted down to 8.8.  No      transfusions were given during the hospital course.  The patient was      advised to follow up closely with her primary care physician, Dr.      Elisabeth Most, in one week for a repeat CBC.  The patient was advised to      come back to the hospital if rectal bleeding were to recur.   1.  HYPERTENSION.  The patient's blood pressures were somewhat labile during      the hospitalization.  At one point, her systolic blood pressure      increased to over 200.  The patient was maintained on Vasotec 10 mg      daily.  Given the patient's trend for worsening hypertension, a decision      was made to start the patient on treatment with Lopressor 25 mg 1/2      tablet b.i.d., as well.  She will need close follow up and monitoring of      her blood pressures in the outpatient setting.   1.  TOBACCO ABUSE.  The patient was admonished to stop smoking.   1.  DEPRESSION SLASH ANXIETY.  The patient remained stable on Cymbalta and      Valium.  DISCHARGE LABORATORY DATA:  WBC 6.4, hemoglobin 8.8, hematocrit 25.7, MCV  92.5, platelets 203.       Elliot Cousin, M.D.  Electronically Signed     DF/MEDQ  D:  06/10/2005  T:  06/11/2005  Job:  [id number]   cc:   Lovenia Kim, D.O.  Fax: [phone]   Lina Sar, M.D. [facility]  [address]  [place]  [place] [address]   Venita Lick. Russella Dar, M.D. [facility]  [address]  [place]  [place] [address]

## 2010-06-06 NOTE — Discharge Summary (Signed)
NAME:  Amy Moss, RACEY NO.:  [id number]   MEDICAL RECORD NO.:  [id number]          PATIENT TYPE:  INP   LOCATION:  3038                         FACILITY:  [facility]   PHYSICIAN:  Madaline Savage, MD        DATE OF BIRTH:  [birth year]   DATE OF ADMISSION:  01/15/2006  DATE OF DISCHARGE:                               DISCHARGE SUMMARY   ADDENDUM:   PRIMARY CARE PHYSICIAN:  Lovenia Kim, D.O.   For a complete discharge summary, see discharge summary dictated by Dr.  Hillery Aldo on January 19, 2006.  For a complete list of discharge  diagnoses, see the discharge diagnoses dictated by Dr. Hillery Aldo.   DISCHARGE MEDICATIONS:  1. Avelox 400 mg once daily for 4 more days.  2. Cymbalta 60 mg once daily.  3. Nu-Iron 150 mg twice daily.  4. Valium 10 mg 3 times a day.  5. Percocet 1 tablet every 6 hours as needed.   HOSPITAL COURSE:  She has been stable since yesterday.  Her breathing is  at her baseline.  Her renal function had worsened from 1 to 1.4  yesterday and her creatinine is now at 1.5.  As her creatinine has  stabilized, I think it is safe to discharge her home today.  She was  taken off the Vasotec and the hydrochlorothiazide.  We have advised her  to follow up with her family doctor Dr. Lovenia Kim in 1 week and  she will get a BMP at that time.  Dr. Lovenia Kim will decide when  to restart her on Vasotec.  She will continue her antibiotics at home  and will complete a 10-day course of antibiotics.   DISPOSITION:  She is now being discharged home in a stable condition.   FOLLOWUP:  She will follow up with Dr. Lovenia Kim in 1 week.  She  was advised to check her BMP in 1 week and she was also advised to stop  taking the Vasotec.      Madaline Savage, MD  Electronically Signed     PKN/MEDQ  D:  01/20/2006  T:  01/20/2006  Job:  [id number]   cc:   Lovenia Kim, D.O.

## 2010-06-06 NOTE — H&P (Signed)
[facility]  Patient:    Amy Moss, Amy Moss                       MRN: [id number] Adm. Date:  [id number] Attending:  Thyra Breed CC:         Ronnald Nian, M.D.  Dr. 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  Since her last evaluation, she has been relatively stable on her current dose of methadone 10 mg three times a day with the Duragesic 50 mcg every two days. She states that this brings her pain down to about 6/10.  She is somewhat stressed out today and has developed a headache with associated visual changes which she gets with her migraines.  She has noted that the medications are resulting in some constipation but overall she feels better in general.  CURRENT MEDICATIONS:  We discussed at her last visit the possibility of having her take her diazepam three times a day and I recommended that she discuss this with Dr. Hortencia Pilar, her psychiatrist, because I feel like it might help to reduce some of her discomfort, as well as keep her more relaxed in general. Her other medications include Zoloft, Premarin, Zestril, and Prevacid.  PHYSICAL EXAMINATION:  VITAL SIGNS:  Blood pressure 131/78, heart rate 108, respiratory rate 18.  O2 saturation 97%.  Pain level 6/10.  NEUROLOGIC:  She exhibits symmetric deep tendon reflexes at the knees, hypoactive but symmetric at the ankles.  Straight leg raise signs are negative.  IMPRESSION: 1. 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 2. Other medical problems per Dr. Susann Givens. 3. Psychiatric problems per Dr. Hortencia Pilar.  DISPOSITION: 1. Continue on methadone 10 mg one p.o. q.8h. #90 with no refill. 2. Continue on Duragesic 50 mcg every two days #15. 3. Continue with Senokot  protocol for constipation. 4. Follow up with me in 4-8 weeks. DD:  08/13/00 TD:  08/14/00 Job: [id number]

## 2010-06-06 NOTE — Op Note (Signed)
NAME:  KAMILAH, CORREIA NO.:  [id number]   MEDICAL RECORD NO.:  [id number]          PATIENT TYPE:  OIB   LOCATION:  5039                         FACILITY:  [facility]   PHYSICIAN:  Almedia Balls. Ranell Patrick, M.D. DATE OF BIRTH:  04-11-1940   DATE OF PROCEDURE:  DATE OF DISCHARGE:                               OPERATIVE REPORT   PREOPERATIVE DIAGNOSIS:  Left shoulder, greater tuberosity fracture.   POSTOPERATIVE DIAGNOSIS:  Left shoulder, greater tuberosity fracture  with displacement as well as superior labral tear anterior to posterior  with unstable biceps anchor and left shoulder capsulitis.   PROCEDURE PERFORMED:  Left shoulder arthroscopy with extensive  intertrochanteric debridement including debridement of labral tear,  arthroscopic biceps tenotomy, followed by open greater tuberosity  repair/rotator cuff repair.   SURGEON:  Malon Kindle, M.D.   ASSISTANT:  Donnie Coffin. Dixon, P.A.C.   ANESTHESIA:  General anesthesia plus interscalene block anesthesia was  used.   ESTIMATED BLOOD LOSS:  Minimal.   FLUIDS REPLACED:  1,500 mL crystalloid.   INSTRUMENT COUNTS:  Correct.   COMPLICATIONS:  There were no complications.   Preoperative antibiotics were given to the patient.   INDICATIONS FOR PROCEDURE:  The patient is a 70 year old female who  sustained an injury to her left shoulder.  The patient is now several  weeks out from this injury due to medical complications, is just now  presenting for surgery.  She has a displaced greater tuberosity fracture  after shoulder dislocation.  The patient suffered this injury with a  ground level fall.  The patient is right hand dominant.  Again, she has  a displaced greater tuberosity fracture with progressive displacement on  serial x-rays.  After her injury now here for open reduction internal  fixation of this greater tuberosity and repair of her rotator cuff tear.  Informed consent was obtained.   DESCRIPTION OF  PROCEDURE:  After an adequate level of anesthesia was  achieved, the patient was positioned in a modified beach chair position.  All neurovascular structures prepped appropriately, left shoulder  sterilely prepped and draped in the usual manner.  We went ahead and  initially approached the shoulder arthroscopically using standard  arthroscopically portals.  Anterior and posterior portals created in a  similar fashion with the infiltration of the skin.  A 0.25% Marcaine  with epinephrine, followed by incision with the #11 blade scalpel.  Introduction to the cannula into the joint using blunt obturators.  Diagnostic arthroscopy revealed evidence of a significant superior  labral tear to the posterior with an unstable biceps anchor.  The  patient had a Buford complex with a cord like middle glenohumeral  ligament and no anterior or superior attachment over the superior  labrum. The patient had a tear extending all the way back to the 1  o'clock position in this left shoulder.  Went in and performed a biceps  tenotomy and a debridement of the labral tear including release of the  middle glenohumeral ligament, also debridement of the rotator interval,  which was extremely scarred and a quite a bit of capsulitis noted when  it freed up the subscapularis so it moved easily during internal and  external rotation of the shoulder.  The patient's anterior leading edge  of the rotator cuff was intact, but just beyond that, there was a large  defect with the greater tuberosity displaced and the rotator cuff tear  associated.   At this point, following the complete labral debridement and biceps  tenotomy and debridement of the anterior shoulder, we did inspect the  posterior aspect of the shoulder small capsulitis, but otherwise, again  a displaced tuberosity.  Went ahead and concluded the arthroscopy.  Went  ahead and opened the shoulder through an incision between the anterior  and lateral heads of  the deltoid, this was a mini-open type approach,  which was extended into about a 3 inch incision, dissection carried  sharply down through subcutaneous tissues, dissection down to the  deltoid.  We split the deltoid in line with its fibers in the raphe  between the anterior and lateral heads of the deltoid.  We identified  the rotator cuff tear as well as the displaced greater tuberosity  fracture.  We freshened up the donor site, mobilized the tendon, and the  displaced greater tuberosity.  Placed two #2 FiberWire sutures lateral  to the greater tuberosity in a horizontal mattress technique with both  those sutures coming out over the top of the greater tuberosity.  We  also then placed an additional two FiberWire sutures through the  fractured greater tuberosity piece and again with some mattress extra  passes, gained good purchase on this greater tuberosity.  We then took  obturator four of the defect and brought that out through the humeral  shaft distally and then also planned on taking these 2 tension band type  sutures over the top of the tuberosity and putting those down with  PushLocks more anteriorly and distally.  Thus, we had a double layered  closure, this reduced perfectly.  Unfortunately, the bone was so soft in  the metaphyseal area, that we could not use the bicortical screw anchors  that we had initially planned on using for the direct fixation of the  fracture fragment down into the fracture bed, so we had to actually use  sutures that were passed out through the humeral bone distally.  The  combination of those sutures tied in a horizontal mattress fashion, and  then to each other as well as to PushLock anchors, 2 of the 5 PushLocks  to serve as a tension band over the top.  We had a nice low profile  repair with no dog ears at this point and this closed down the rotator cuff tear in the anterior aspect of the shoulder nicely.  At this point,  we then thoroughly  irrigated. We did have to release some adhesions  using the surgeon's finger prior to the actual fixation.  We removed  quite a bit of bursal tissue and hemorrhage as well, but we were very  pleased with the repair.  Swept my finger over the subscapularis and  that was nice and mobile and we did not make an attempt to tenodesis the  biceps as one of our PushLocks went directly through the biceps groove  and did not want to compromise that fixation at all.  At this point,  closed the deltoid to itself with 0 Vicryl suture followed by 2-0 Vicryl  subcutaneous and 4-0 Monocryl for the skin.   The patient tolerated the surgery well, was placed in  a shoulder sling  immobilizer.      Almedia Balls. Ranell Patrick, M.D.  Electronically Signed     SRN/MEDQ  D:  01/21/2006  T:  01/22/2006  Job:  [id number]

## 2010-06-06 NOTE — Op Note (Signed)
NAME:  Amy Moss, Amy Moss NO.:  [id number]   MEDICAL RECORD NO.:  [id number]          PATIENT TYPE:  OIB   LOCATION:  5155                         FACILITY:  [facility]   PHYSICIAN:  Almedia Balls. Ranell Patrick, M.D. DATE OF BIRTH:  14-Mar-1940   DATE OF PROCEDURE:  03/01/2006  DATE OF DISCHARGE:                               OPERATIVE REPORT   PREOPERATIVE DIAGNOSIS:  Left shoulder displaced greater tuberosity  fragment, status post repair of greater tuberosity and rotator cuff  repair after shoulder dislocation.   POSTOPERATIVE DIAGNOSIS:  1. Left shoulder displaced greater tuberosity fragment, status post      repair of greater tuberosity and rotator cuff repair after shoulder      dislocation.  2. Adhesive capsulitis.   PROCEDURE PERFORMED:  1. Left shoulder excision of greater tuberosity fragment, rotator cuff      repair.  2. Manipulation under anesthesia.   ATTENDING SURGEON:  Almedia Balls. Ranell Patrick, M.D.   ASSISTANT:  Donnie Coffin. Durwin Nora, P.A.   ANESTHESIA:  General anesthesia plus interscalene block anesthesia was  used.   ESTIMATED BLOOD LOSS:  Minimal.   FLUID REPLACEMENT:  1000 mL crystalloid.   INSTRUMENT COUNT:  Was correct.   COMPLICATIONS:  There were no complications.   INDICATIONS:  The patient is a 70 year old female who suffered a left  shoulder dislocation.  The patient sustained a large greater tuberosity  fracture which was displaced in an unacceptable position.  The patient  was taken to surgery six weeks ago for repair of her greater tuberosity  and rotator cuff repair.  The patient went on to develop stiffness and  pain in her shoulder and evidence on recent x-rays that at least part of  the greater tuberosity had re-displaced.  The patient presents now for  either repair or debridement of the tuberosity fragment and cuff repair.  Informed consent was obtained.   DESCRIPTION OF OPERATION:  After an adequate level of anesthesia was  achieved, the  patient was positioned in the modified beach chair  position.  All neurovascular structures were padded appropriately.  The  left shoulder was examined under anesthesia.  The patient had a fairly  stiff shoulder with forward elevation limited about 90 degrees, external  rotation limited to 20 degrees, internal rotation 10.  We went ahead and  performed a manipulation under anesthesia, getting her up to about 160  to 170 degrees, abduction 110 degrees, external rotation was out to  about 60, internal rotation about 45 degrees.  After completion of a  manipulation under anesthesia with good disruption of capsular tissue  based on an audible pop, we went ahead and sterilely prepped and draped  the left shoulder in the usual manner.  We entered the shoulder through  the patient's prior deltoid splitting incision.  This was done from the  anterolateral border of the acromion down about 6-7 cm.  Dissection  carried sharply down through subcutaneous tissues.  Split the deltoid in  line with its fibers in the raphae between the anterolateral heads of  the deltoid.  We identified the displaced tuberosity fragment.  Went  ahead and excised that as it seemed to be under a lot of tension.  This  left [place] with essentially a side-to-side gap in the rotator cuff and a  defect in the greater tuberosity.   Once we removed a couple of pieces of bone, we were able to effectively  repair the rotator cuff side-to-side using a #2 FiberWire suture.  We  did remove some FiberWire suture which was loose.  Digitally I was able  to break out the subdeltoid scarring present on the entire rotator cuff,  both subscapular as well as the teres minor on the back.  The remainder  of the tuberosity appeared to be well fixed and sutures were left alone  for that.  He did bring an x-ray and obtained a hard copy x-ray  demonstrating the remainder of the tuberosity was in good position.  The  fragment had been removed.  Again,  I felt like the subacromial interval  and subdeltoid interval was free of any debris at this point, thoroughly  irrigating this area and then closing the deltoid to itself with  interrupted 0 Vicryl suture followed by 2-0 Vicryl subcutaneous and 4-0  Monocryl for skin and Steri-Strips applied followed by a sterile  dressing.  The patient tolerated the surgery well.      Almedia Balls. Ranell Patrick, M.D.  Electronically Signed     SRN/MEDQ  D:  03/01/2006  T:  03/02/2006  Job:  [id number]

## 2010-06-06 NOTE — H&P (Signed)
NAME:  Amy Moss, Amy Moss NO.:  [id number]   MEDICAL RECORD NO.:  [id number]          PATIENT TYPE:  INP   LOCATION:  1431                         FACILITY:  [facility]   PHYSICIAN:  Elliot Cousin, M.D.    DATE OF BIRTH:  [birth year]   DATE OF ADMISSION:  05/22/2005  DATE OF DISCHARGE:                                HISTORY & PHYSICAL   PRIMARY CARE PHYSICIAN:  Lovenia Kim, D.O.   CHIEF COMPLAINT:  Bloody stools and abdominal cramping.   HISTORY OF PRESENT ILLNESS:  The patient is a 70 year old lady with a past  medical history significant for lower GI bleed, diverticulosis, hemorrhoids,  and a previous brainstem stroke, who presents to the emergency department  following several episodes of bloody stools this morning.  The patient says  that she was awakened from her sleep with lower abdominal cramping.  This  was followed shortly thereafter by a large bloody stool.  She says that the  blood filled the toilet bowl.  She experienced another bowel movement  primarily consistenting of blood clots.  While in the emergency department,  the patient has had two more blood-filled stools.  These were noted by the  registered nurse and apparently the volume of each bloody stool measured  approximately 250 mL.  The patient says that the abdominal cramping occurs  immediately before the bowel movement.  Otherwise, she has no other  associated abdominal pain.  No recent history of nausea or vomiting.  She  has no complaints of headaches, dizziness, chest pain or shortness of  breath.  She denies alcohol and NSAID use.  Her history is significant for  GI bleeding in December 2004, for which she underwent a colonoscopy by Dr.  Leone Payor.  The colonoscopy at that time revealed internal and external  hemorrhoids and diverticulosis without any evidence of active bleeding per  his exam.  Since that time, the patient has had no further rectal bleeding.   During the initial evaluation in  the emergency department, the patient is  noted to be hemodynamically stable.  Her hemoglobin is 13.4 currently.  However, given her presentation and ongoing GI bleeding, she will be  admitted for further evaluation and management.   PAST MEDICAL HISTORY:  1.  Lower GI bleed with near-syncope in December 2004.  The patient was      evaluated via a colonoscopy by Dr. Leone Payor.  The colonoscopy revealed      internal and external hemorrhoids as well as diverticulosis.  The      patient also has a history of a GI bleed in 1994 thought to be secondary      to diverticula.  2.  Hypertension.  3.  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  4.  Depression.  5.  Anxiety.  6.  Status post multiple lumbar laminectomies and diskectomies in the past.  7.  Chronic low back pain and chronic sacral pain.  8.  Tobacco abuse.   ALLERGIES:  No known drug allergies.   MEDICATIONS:  1.  Enalapril 10 mg half a tablet b.i.d.  2.  Cymbalta 60 mg b.i.d.  3.  Diazepam 10 mg t.i.d.   SOCIAL HISTORY:  The patient lives in [place], [place].  She is  widowed.  She has two grown daughters who have recently moved back in with  her.  This has caused her some added stress and anxiety.  She is retired.  She no longer drives.  She denies alcohol and illicit drug use.  She does,  however, smoke a half pack to one pack of cigarettes per day and she has  been doing so for approximately 40 or more years.   FAMILY HISTORY:  Her father died at 30 years of age secondary to a brainstem  stroke.  Her mother died at 33 years of age secondary to Alzheimer's  disease.   REVIEW OF SYSTEMS:  Her review of systems is positive for a decrease in  memory, low back pain, increase in anxiety and stress as indicated above,  depression but not suicidal.  Otherwise review of systems is negative.   PHYSICAL EXAMINATION:  VITAL SIGNS:  Temperature 98.8, blood  pressure  152/98, pulse 88, respiratory rate 18, oxygen saturation 98% on room air.  GENERAL:  The patient is a pleasant 70 year old Caucasian lady who is  currently lying in bed in no acute distress.  HEENT:  Head is normocephalic, nontraumatic.  Pupils equal, round and  reactive to light and accommodation, extraocular movements are intact.  Conjunctivae are clear.  Sclerae are white.  Tympanic membranes are mildly  obscured by cerumen bilaterally, otherwise no acute change.  Nasal mucosa is  dry, no sinus tenderness.  Oropharynx reveals fair to poor dentition.  Mucous membranes are mildly dry.  No posterior exudate or erythema.  NECK:  Supple, no adenopathy, no thyromegaly, no bruit, no JVD.  LUNGS:  Clear to auscultation bilaterally.  CARDIAC:  S1, S2, with no murmurs, rubs or gallops.  ABDOMEN:  Mildly obese, positive bowel sounds, soft, nontender,  nondistended.  No hepatosplenomegaly, no masses palpated.  RECTAL:  There is a small amount of maroon-colored stool at the anus/rectum.  GENITOURINARY:  Deferred.  EXTREMITIES:  Pedal pulses are 2+ bilaterally.  No pretibial edema, no pedal  edema.  She has mild diffuse arthritic changes in her knees, her hands and  her feet.  NEUROLOGIC:  The patient is alert and oriented x3.  Cranial nerves II-XII  are intact.  Strength in the supine position is 5/5.  Sensation is intact.  BACK:  The patient has only minimal to modest low back tenderness of the  lumbosacral muscles.  No spasms.  PSYCHOLOGICAL:  The patient is alert and oriented x3.  Affect is pleasant,  although she appears a bit more anxious when discussing the current home  situation with her daughters moving back in.   ADMISSION LABORATORY DATA:  WBC 8.7, hemoglobin 13.4, hematocrit 39.0, MCV  95, platelets 219.  Sodium 138, potassium 4.4, chloride 103, CO2 27, glucose  91, BUN 36, creatinine 1.8, calcium 9.2.   ASSESSMENT: 1.  Hematochezia/gastrointestinal bleed.  Given the  patient's previous      history, the GI bleed is probably secondary to either internal      hemorrhoids  and/or diverticula.  Cannot rule out an upper gastrointestinal bleed.  This  is the first episode of gastrointestinal bleeding since her hospitalization  in December 2004.  The patient explicitly denies alcohol and nonsteroidal  anti-inflammatory drug use.  She is  currently hemodynamically stable.  Her  hemoglobin and hematocrit are within normal limits; however, I do expect  these to decrease as the patient receives volume repletion.   1.  Azotemia/renal insufficiency.  The patient's BUN and creatinine are 36      and 1.8, respectively.  She has a history of acute renal insufficiency      in December 2004 secondary to the gastrointestinal bleed.  The patient      is also chronically treated with an ACE inhibitor.  Given the overall      clinical presentation, the patient has prerenal azotemia secondary to      gastrointestinal bleeding and hypovolemia.   1.  Hypertension.  The patient's blood pressure is currently stable.   1.  Depression with anxiety.  The patient is chronically treated with      Cymbalta and Valium.  These medications will be continued during the      hospital course.   PLAN:  1.  The patient will be admitted for further evaluation and management.  2.  Will type and cross two units of packed red blood cells and hold for      now.  3.  Will monitor the patient's hematocrit and hemoglobin every six hours x24      hours and daily thereafter.  If her hemoglobin falls several grams      and/or if she becomes hemodynamically unstable, she will be transfused.  4.  Will check an amylase and lipase for further evaluation.  5.  Will also check a TSH.  6.  Consult gastroenterology.  Dr. Christella Hartigan has already been notified.  7.  Volume repletion with normal saline at 125 mL/hr. X1 L and then 50      mL/hr.  IV fluids will be further adjusted as clinically indicated.  8.   Will start Protonix 40 mg IV q.12h.  9.  DVT prophylaxis with bilateral lower extremity compression devices.  10. Continue chronic medications.  Will decrease the dose of enalapril from      5 mg b.i.d. to 2.5 mg daily.  11. Continue to monitor renal function status.      Elliot Cousin, M.D.  Electronically Signed     DF/MEDQ  D:  05/22/2005  T:  05/22/2005  Job:  [id number]   cc:   Lovenia Kim, D.O.  Fax: [phone]

## 2010-06-06 NOTE — H&P (Signed)
[facility]  Patient:    COLLYNS, MCQUIGG                       MRN: [id number] Adm. Date:  [id number] Attending:  Thyra Breed CC:         Ronnald Nian, M.D.   History and Physical  FOLLOWUP EVALUATION:  HISTORY OF PRESENT ILLNESS:  The patient presents for re-evaluation of her post laminectomy pain syndrome with radicular discomfort into the left lower extremity.  Since her last evaluation, she has done better with a combination of Duragesic at 25 mcg every three days and the methadone 10 mg three times a day.  She feels as though she is anxious all the time and had been placed on Valium, but her mental health Ronne Stefanski has not advocated continuing her on this.  In general, she feels very comfortable, but does rate her pain at a level of 8 out of 10.  I discussed increasing the Duragesic to 50 mcg every three days and continue on the methadone for the time being, and if she is improved and tolerating her situation well, considering tapering back on the methadone so that we just keep it at a dose where it helps to prevent tolerance to a degree.  MEDICATIONS:  She is taking Premarin, Zoloft, and Prevacid as an addition. She has been off the Zestril until she sees Dr. Clarene Duke.  REVIEW OF SYSTEMS:  She notes that laying down and applying heat help her back.  Standing, sitting, or walking for any length of time increases her discomfort.  PHYSICAL EXAMINATION:  VITAL SIGNS:  Blood pressure 126/71, heart rate 101, respiratory rate 18, and O2 saturations 95%.  Pain level is 8 out of 10.  EXTREMITIES:  She demonstrates symmetric deep tendon reflexes in the knees and hypoactive, but symmetric at the ankles.  Straight leg raise signs were negative.  IMPRESSION: 1. 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 2. Other medical problems per Dr. Susann Givens, her new primary care physician. 3. Mental health problems per mental health  Nastasha Reising.  DISPOSITION: 1. Continue on methadone 10 mg p.o. q.8h., #90 with no refill. 2. Increase Duragesic to 50 mcg one apply every two days, #15 with no refill. 3. Followup with me in four weeks, at which time will consider reducing the    methadone if she is feeling better. 4. She was encouraged to discuss her mental health issues with her mental    health Jimeka Balan. DD:  07/14/00 TD:  07/14/00 Job: [id number]

## 2010-06-06 NOTE — Discharge Summary (Signed)
NAME:  Amy Moss, Amy Moss                ACCOUNT NO.:  [id number]   MEDICAL RECORD NO.:  [id number]          PATIENT TYPE:  INP   LOCATION:  1431                         FACILITY:  [facility]   PHYSICIAN:  Jonna L. Robb Matar, M.D.DATE OF BIRTH:  [birth year]   DATE OF ADMISSION:  05/22/2005  DATE OF DISCHARGE:  05/25/2005                                 DISCHARGE SUMMARY   PRIMARY CARE PHYSICIAN:  Dr. Marisue Brooklyn   FINAL DIAGNOSES:  1.  Diverticular bleed.  2.  Anemia secondary to acute blood loss.  3.  Depression.  4.  Dehydration.  5.  Hypertension.  6.  Smoker.   CONSULTS:  [facility]   ALLERGIES:  None.   CODE STATUS:  Full.   HISTORY:  This 70 year old Caucasian female had bloody stools and maroon-  colored clots per rectum accompanied by abdominal cramping.  Patient has had  previous episodes of hematochezia.  In 2004 she had a colonoscopy at the  time of her bleed per Dr. Leone Payor and she had scattered diverticula,  internal and external hemorrhoids.  Other medical problems include  hypertension, a small brain stem stroke in 2000, __________  disequilibrium,  depression, multiple lumbar laminectomies with chronic lower back and sacral  pain.   HOSPITAL COURSE:  Within 24 hours the patient went from a hemoglobin of 13.4  down to 9.4 and ultimately required a number of units of blood.  She was  seen in consultation by gastroenterology who suggested observation and  transfusion.  Tagged red cell scan was negative.  Some consideration was  given to an elective subtotal colectomy in view of the increased risk for  recurrent bleeding but at present this was not felt to be warranted.  The  patient came in for some mild dehydration but her BUN and creatinine  improved back to normal with BUN of 8, creatinine 1.1.  Hemoglobin was up to  10.6.   DISPOSITION:  If the patient has no further episodes of bleeding she will be  discharged on May 7 on a diet of no pits, seeds,  or husks.  She is to eat  red meat three to four times a week for a month and not to take any aspirin,  ibuprofen, or Naproxen.  She is to take Colace 100 mg one to two times a  day, Nu-Iron 150 one time a day for two months, enalapril 10 mg a half  daily, Cymbalta 60 mg one to two times a day, and diazepam b.i.d. to t.i.d.  She is to return to see Dr. Elisabeth Most in one week and get a repeat CBC.  She  will see gastroenterologist p.r.n.      Jonna L. Robb Matar, M.D.  Electronically Signed     JLB/MEDQ  D:  05/24/2005  T:  05/25/2005  Job:  [id number]   cc:   Lovenia Kim, D.O.  Fax: [phone]

## 2010-06-06 NOTE — Consult Note (Signed)
NAME:  Amy Moss, Amy Moss                          ACCOUNT NO.:  [id number]   MEDICAL RECORD NO.:  [id number]                   PATIENT TYPE:  INP   LOCATION:  0377                                 FACILITY:  [facility]   PHYSICIAN:  Iva Boop, M.D. [facility]           DATE OF BIRTH:  [birth year]   DATE OF CONSULTATION:  12/20/2002  DATE OF DISCHARGE:                                   CONSULTATION   REQUESTING PHYSICIAN:  Norwood Levo, M.D.   PRIMARY CARE PHYSICIAN:  Sharlot Gowda, M.D.   REASON FOR CONSULTATION:  GI bleeding.   HISTORY:  This is a 70 year old white woman who awakened very early this  morning just after midnight with bloody stools, mild crampy lower quadrant  abdominal pain, urgent bowel movements.  Had mild dizziness and actually  fell off the side of the commode, landing on her left hip.  She had several  bloody bowel movements, too, since coming to the hospital with maroon  stools.  No clear history of melena that I can tell.  No aspirin or  nonsteroidal abuse or anticoagulants.  There has been no obvious antecedent  bleeding though her daughter thinks the patient has had some bowel movements  that have smelled similar to what she is having now.  The patient  steadfastly denies any melena.  She vomited once today.  She says she feels  weak, and she is in a great deal of pain in her legs and her coccyx at this  point.   REVIEW OF SYSTEMS:  Notable for chronic back pain, and she had presyncope  today as mentioned above.  She has chronic insomnia difficulties.  All other  systems appear negative.   HOME MEDICATIONS:  1. Valium 5 mg three times daily p.r.n.  2. Duragesic patch 50 mg every 72 hours.   DRUG ALLERGIES:  None known.   PAST MEDICAL HISTORY:  1. Diverticulosis with bleeding in 1994, with negative colonoscopy by Dr.     Lina Sar except for the diverticulosis.  2. Stroke in 2000, with brainstem damage, some transient hypotension and     orthostatic  change and dysequilibrium.  3. Chronic low back pain thought secondary to disk disease.  4. Possible hypertension.  5. Status post multiple laminectomies and diskectomies.   SOCIAL HISTORY:  No tobacco or alcohol.   FAMILY HISTORY:  An aunt with colon cancer.  Mother died of cancer of  unknown type.   PHYSICAL EXAM:  GENERAL:  Well-developed middle-aged white woman looking  somewhat older than stated age.  VITAL SIGNS:  Temperature 97, blood pressure 152/73, pulse 89.  HEENT:  The eyes are anicteric.  Skin is pale.  Mouth free of lesions.  NECK:  Supple.  CHEST:  Clear.  HEART:  S1, S2.  No murmurs or gallops are heard.  ABDOMEN:  Soft.  Hypoactive bowel sounds.  No mass.  Nontender.  RECTAL:  Exam was not performed.  She had gross blood in the ER according to  my P.A.'s exam.  EXTREMITIES:  No cyanosis, clubbing, or edema.  NEUROLOGIC:  Alert and oriented x3.  LYMPH NODES:  I detect no neck or supraclavicular nodes.   LABORATORY DATA:  Her hemoglobin is 12.3, 35.7, subsequent hemoglobin 10.2,  white count 17.8.  Glucose 200.  Electrolytes normal.  BUN and creatinine  22/1.3.  PT/INR normal.  PTT normal.  Platelet count normal.   ASSESSMENT:  1. Acute gastrointestinal bleeding, likely lower in origin.  2. Lower extremity and coccygeal pain and pressure related to fall.  3. Chronic back pain, which is playing some role in her pain here.   RECOMMENDATIONS AND PLAN:  1. She says she does not think she could tolerate a prep at this point, so     we will observe her and support her along with Dr. Donald Siva.  2. Plan for colonoscopy in the next one to two days, depending upon her     clinical course.  Would use MiraLax prep, which is only 64 oz as opposed     to 4 L of GoLYTELY.  3. Further plans depending on clinical course.   I appreciate the opportunity to care for this patient.                                               Iva Boop, M.D. [facility]    CEG/MEDQ  D:   12/20/2002  T:  12/20/2002  Job:  [id number]   cc:   Sharlot Gowda, M.D.  [address] [place], [place] [address]  Fax: [phone]

## 2010-06-06 NOTE — Discharge Summary (Signed)
NAME:  Amy Moss, [name]:  [id number]   MEDICAL RECORD NO.:  [id number]          PATIENT TYPE:  INP   LOCATION:  1611                         FACILITY:  [facility]   PHYSICIAN:  Lonia Blood, M.D.       DATE OF BIRTH:  [birth year]   DATE OF ADMISSION:  01/07/2006  DATE OF DISCHARGE:  01/11/2006                               DISCHARGE SUMMARY   The patient's primary care physician is Dr. Marisue Brooklyn.   DISCHARGE DIAGNOSES:  1. Left lower lobe community-acquired pneumonia.  2. Shoulder fracture.  3. Dehydration, acute renal failure, resolved.  4. Urinary tract infection, resolved.  5. Hypertension.  6. Depression.  7. Tobacco abuse.   DISCHARGE MEDICATIONS:  1. Enalapril 5 mg twice a day.  2. Valium 10 mg three times a day.  3. Percocet 1 tablet every 4 hours as needed for pain.  4. Cymbalta 60 mg daily.  5. Xopenex 1 puff times a day.  6. Iron daily.  7. Avelox 400 mg daily for 5 days.   CONDITION ON DISCHARGE:  The patient was discharged in good condition.  At the time of discharge, she was instructed to follow up with Dr. Marisue Brooklyn as needed and to follow up with Dr. Ranell Patrick for the shoulder  surgery.   PROCEDURES:  1. During this admission, January 07, 2006, the patient underwent a      chest x-ray with finding of left lower lobe infiltrate.  2. January 10, 2006, follow-up chest x-ray with findings of worsening      left lower lobe pneumonia.   CONSULTATIONS:  No consultations were obtained.  For admission history  and physical, refer to dictated H&P done by Dr. Brien Few.   HOSPITAL COURSE:  Problem 1:  LEFT LOWER LOBE PNEUMONIA:  [name] was  admitted to acute care at [facility].  She was placed on  intravenous Rocephin, azithromycin.  The patient's course was one of  progressive recovery with the improvement on a daily basis.  The  patient's antibiotics were continued intravenously until January 10, 2006 and she was switched to oral  antibiotics on January 11, 2006.  The  patient continued to improve and it was felt she was safe for discharge.   Problem 2:  LEFT SHOULDER FRACTURE:  The patient was seen in  consultation by Dr. Ranell Patrick who recommended follow-up as an outpatient  for surgery.  The patient was discharged with oral pain medication to  help with pain control.   Problem 3:  DEHYDRATION ON ADMISSION:  The patient's dehydration  resolved during this admission, and at the time of discharge, the  patient's creatinine was 0.9.      Lonia Blood, M.D.  Electronically Signed     SL/MEDQ  D:  02/17/2006  T:  02/17/2006  Job:  [id number]

## 2010-06-06 NOTE — H&P (Signed)
NAME:  Amy Moss, Amy Moss NO.:  [id number]   MEDICAL RECORD NO.:  [id number]          PATIENT TYPE:  INP   LOCATION:  1611                         FACILITY:  [facility]   PHYSICIAN:  Isidor Holts, M.D.  DATE OF BIRTH:  14-Feb-1940   DATE OF ADMISSION:  01/07/2006  DATE OF DISCHARGE:                              HISTORY & PHYSICAL   PRIMARY MEDICAL DOCTOR:  Lovenia Kim, D.O.   CHIEF COMPLAINT:  Cough productive of yellow phlegm for approximately 3  days; was noted to be hypoxemic today. Also left shoulder pain for 3  weeks.   HISTORY OF PRESENT ILLNESS:  This is a 70 year old female. For past  medical history, see below.  The patient was admitted to was admitted to  [facility] on 12/15/2005 to 12/18/2005  for diverticular  bleed; and has had no recurrence since. However, approximately 3 weeks  ago, she fell, sustaining an injury to her left shoulder.  She presented  to the emergency department on 12/25/2005 where she had an x-ray of the  affected shoulder, which demonstrated anterior dislocation of left  glenohumeral joint with displaced fracture of the greater tuberosity.  This injury was reduced in the emergency department by Dr. Ranell Patrick.  The  patient was placed in a sling and commenced on analgesics and Robaxin,  then discharged to be followed up in Dr. Dietrich Pates clinic, i.e.  orthopedics.  She was seen on 01/05/2006 at the orthopedic clinic and an  x-ray of the effected shoulder was done.  Apparently, this did not  appear satisfactory; and she was scheduled for surgical intervention on  01/07/2006.  Reportedly when she showed up in the office, she was found  to be hypoxemic with saturations as low as 79% she was, therefore,  referred to her primary MD, who happened to be out of the office.  She,  therefore, went to [facility] where a chest x-ray was  done; and she was referred from there to the Emergency Department at  [facility] where a chest x-ray showed left lower lobe  infiltrate.   PAST MEDICAL HISTORY:  1..  Admission to [facility] 12/15/2005  to 12/18/2005 for diverticular bleed.  1. Diverticulosis.  2. Internal hemorrhoids.  3. Recurrent episodes of lower GI bleed.  4. Hypertension.  5. Depression.  6. History of brain stem CVA 2000.  7. Status post hysterectomy.  8. Status post multiple lumbar laminectomies  9. Smoking history.   MEDICATIONS:  1. Cymbalta 120 mg p.o. daily.  2. Valium 10 mg p.o. t.i.d.  3. Vasotec 5 mg p.o. b.i.d.  4. Nu-Iron 150 mg p.o. daily.  5. Multivitamin one p.o. daily.  6. Percocet (5/325) one p.o. p.r.n. q.4-6 h.  7. Robaxin 1 p.o. q.i.d.Marland Kitchen   ALLERGIES:  No known drug allergies.   REVIEW OF SYSTEMS:  As in HPI and chief complaint.  The patient states  that she has had a troublesome cough since 01/05/2006, productive of  yellowish phlegm.  Denies chest pain.  Denies shortness of breath.  Denies fever or chills.  However,  has felt somewhat unwell   SOCIAL HISTORY:  The patient is widowed.  Her 2 daughters live with her.  She still smokes approximately 1/2 pack of cigarettes per day.  Denies  alcohol use or drug abuse.  She is retired, used to work in the Print production planner at [employer].   FAMILY HISTORY:  The patient's father died at age 64, status post CVA.  Her mother died at age 7 with dementia.  The patient has three  daughters living, who are healthy.  Family history is otherwise  noncontributory.   PHYSICAL EXAMINATION:  VITAL SIGNS:  Temperature 98.2, pulse 105 per  minute regular, respiratory 20, BP 128/61 mmHg, pulse oximeter 94% on 3  liters of oxygen.  GENERAL:  The patient appears quite comfortable, alert, oriented,  communicative, and not short of breath at rest.  HEENT: No clinical pallor, no jaundice.  No conjunctival injection.  NECK:  Supple.  JVP not seen.  No palpable lymphadenopathy.  No palpable  goiter.   CHEST:  Clinically clear to auscultation, although there is diminished  air entry in the left base.  There are no crackles or wheezes.  HEART:  Sounds S1-S2 heard, normal, regular, no murmurs.  ABDOMEN:  Full, soft, and nontender.  There is no palpable organomegaly.  No palpable masses.  Normal bowel sounds.  EXTREMITIES:  Lower extremity examination no pitting edema, palpable  peripheral pulses.  MUSCULOSKELETAL SYSTEM:  The patient is noted to have extensive, but  fading bruises in the left upper arm; and there is restricted range of  motion left shoulder.  The rest of musculoskeletal system is remarkable  only for osteoarthritic changes.  CENTRAL NERVOUS SYSTEM:  No focal neurologic deficit on gross  examination.   INVESTIGATIONS:  CBC:  WBC 19.7, neutrophils 87%, hemoglobin 12.3,  hematocrit 36.8, platelets 289.  Electrolytes:  Sodium 133, potassium  3.7, chloride 99, CO2 24, BUN 23, creatinine 1.8, glucose 126.  LFTs are  normal.   CHEST X-RAY:  Dated 01/07/2006 shows left lower lobe infiltrate.   ASSESSMENT AND PLAN:  1. Left lower lobe pneumonia, community-acquired.  We shall admit      patient, commence her on a combination of Rocephin and Azithromycin      to cover the commonly encountered community-acquired respiratory      pathogens, utilize bronchodilator nebulizers and oxygen      supplementation.   1. Dehydration/acute renal failure.  The patient's baseline creatinine      is approximately 1.4 based on data from previous admission. This      will be addressed with intravenous fluid hydration.   1. Hypertension.  The patient is currently normotensive.  We shall      hold antihypertensive medications for now, and monitor.   1. Left shoulder dislocation/greater tuberosity fracture.  We shall      utilize an arm sling, analgesic medication, and request orthopedic     consultation from Dr. Ranell Patrick in a.m. of 01/08/2006.   1. Smoking history.  We shall utilize Nicoderm  CQ patch and counsel      appropriately.   Further management will depend on clinical course.      Isidor Holts, M.D.  Electronically Signed     CO/MEDQ  D:  01/07/2006  T:  01/08/2006  Job:  [id number]   cc:   Lovenia Kim, D.O.  Fax: [phone]   Almedia Balls. Ranell Patrick, M.D.  Fax: [phone]
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
## 2010-06-06 NOTE — H&P (Signed)
NAME:  Amy Moss, Amy Moss NO.:  [id number]   MEDICAL RECORD NO.:  [id number]          PATIENT TYPE:  EMS   LOCATION:  ED                           FACILITY:  [facility]   PHYSICIAN:  Amy Moss, M.D. DATE OF BIRTH:  26-Aug-1940   DATE OF ADMISSION:  06/05/2005  DATE OF DISCHARGE:                                HISTORY & PHYSICAL   PRIMARY CARE PHYSICIAN:  Amy Moss, D.O.   CHIEF COMPLAINT:  Attack of diverticulosis.   HISTORY OF PRESENT ILLNESS:  Amy Moss is a 70 year old female with the past  medical history of diverticulosis, accompanied by multiple episodes of  diverticular bleeds, who was recently admitted and treated at [facility] during the time period of May 22, 2005, through May 25, 2005.  During  that particular time, she arrived with the chief complaint of abdominal  cramping sensation, accompanied by bloody stools.  This was determined to be  secondary to a diverticular bleed.  It was noted that, over the course of  the 24 hours of the patient's admission, her hemoglobin declined from 13.4  down to 9.4.  She received multiple units of packed RBC during that  hospitalization.  Gastroenterology was consulted and recommended  observation, along with transfusions.  Apparently, a tagged red cell scan  was determined to be negative and, likewise, it appears that during that  hospitalization, consideration was given to an elective subtotal colectomy.  The patient states that, following her discharge from the hospital, she felt  weak initially.  However, she started taking iron supplements and, up until  a couple of days, she felt back to her baseline.  Last night, she felt light-  headed and weak around 11 o'clock p.m., as she prepared for bed.  This  morning, she was awakened by diffuse abdominal cramps.  She then had a  strong urge to run to the bathroom to have a bowel movement.  She began to  develop a small amount of bloody stools,  reminiscent of blood seen on  previous bowel movements and stated that she knew right away it was her  diverticular bleed that had returned.  After sitting on the toilet, she had  what she referred to as a blow-out.  During this episode, she passed a  large amount of blood, along with gas.  She states that her symptoms are  typical of her diverticulosis.  Her abdominal cramps were relieved by her  bowel movement, although she states that her bowel movement consisted  primarily of blood.  She later developed back pain and states that she  initially attributed this to her history of back surgeries.  She denies  having any nausea or vomiting, fevers or chills.  No shortness of breath, no  chest pain.   PAST MEDICAL HISTORY:  1.  Diverticular bleed.  The patient has had four prior episodes of      diverticular bleed.  She states that the first episode occurred at the      age of 62.  This episode occurred following the delivery of a baby.  2.  Anemia, secondary to acute blood loss.  3.  Depression.  4.  Dehydration.  5.  Hypertension.  6.  Lower GI bleed with a near-syncopal episode in December of 2004.  The      patient underwent a colonoscopy, performed by Dr. Leone Payor, which      revealed internal and external hemorrhoids, along with diverticulosis.  7.  Brain stem stroke in 2000, with residual orthostatic hypotension and      disequilibrium.   PAST SURGICAL HISTORY:  1.  Hysterectomy at the age of 77, secondary to a prolapsed uterus, which      produced multiple bladder infections.  2.  Multiple lumbar laminectomies and discectomies.   ALLERGIES:  No known drug allergies.   HOME MEDICATIONS:  1.  Enalapril 5 mg p.o. twice daily.  2.  Cymbalta 60 mg 2 tablets p.o. once a day.  3.  Diazepam.   SOCIAL HISTORY:  1.  Cigarettes:  The patient has smoked cigarettes for at least 40 years, if      not more.  She initially smoked a half pack to one pack of cigarettes      per day, but  now states that she may smoke a bit more than previously.  2.  Alcohol:  The patient denies alcohol use.   FAMILY HISTORY:  The patient's father died at the age of 81, secondary to a  brain stem infarct.  Mother died at the age of 58, secondary to Alzheimer's  disease.   REVIEW OF SYSTEMS:  As per HPI, otherwise all other systems are negative.   PHYSICAL EXAMINATION:  VITAL SIGNS:  Temperature 97.5, blood pressure  119/81, heart rate 109, respirations 20, O2 sat 99% on room air.  HEENT:  Normocephalic, atraumatic.  Oral mucosa is pink.  Eyes are  anicteric.  Extraocular movements are intact.  Pupils are equally reactive  to light.  NECK:  No JVD, supple, no lymphadenopathy, thyroid is not palpable.  CARDIAC:  S1 and S2 are present, regular rate and rhythm.  RESPIRATORY:  No crackles or wheezes.  ABDOMEN:  Soft, flat, nontender, nondistended.  Hypoactive bowel sounds.  No  hepatomegaly.  EXTREMITIES:  No leg edema.  NEUROLOGICALLY:  The patient is alert and oriented times three.  MUSCULOSKELETAL:  5/5 upper and lower extremity strength.  RECTAL EXAM:  Was done by Dr. Donnetta Hutching in the ER, revealed gross  maroon/red blood, no masses.  The mucosa appeared otherwise to be normal.   LABORATORY DATA:  White blood cell count 7, hemoglobin 10.4, hematocrit  30.4, platelets 241.  Sodium 138, potassium 3.8, chloride 105, CO2 25, BUN  24, creatinine 1.4, glucose 111.  Bilirubin total 0.7, alkaline phosphatase  61, SGOT 20, SGPT 12, total protein 5.6, albumin 3.2, calcium 8.6.   ASSESSMENT AND PLAN:  Amy Moss is a 70 year old female with a past medical  history of multiple lower GI bleeds, related to diverticular bleeding, here  for evaluation.   1.  GI bleed:  Given the patient's previous history, the most likely source      of the patient's current GI bleed is diverticular in origin.  We will     follow the patient's hemoglobin closely for now.  We will type and cross      two units of  packed RBC and hold them for now.  We will also start      Protonix and call GI for further evaluation.  2.  History of hypertension.  This is currently normotensive.  We will hold      the patient's blood pressure medications and monitor closely.  In light      of the patient's being normotensive with some tachycardia, she may be      dehydrated. We will provide IV fluid hydration for now.  3.  History of depression.  We will monitor closely.  4.  For DVT prophylaxis, we will provide SCD boots.  5.  History of cigarette abuse.  We will provide nicotine patch.      Amy Moss, M.D.  Electronically Signed     OR/MEDQ  D:  06/05/2005  T:  06/05/2005  Job:  [id number]   cc:   Amy Moss, D.O.  Fax: [phone]

## 2010-06-06 NOTE — H&P (Signed)
NAME:  HAYZEL, RUBERG NO.:  [id number]   MEDICAL RECORD NO.:  [id number]          PATIENT TYPE:  EMS   LOCATION:  ED                           FACILITY:  [facility]   PHYSICIAN:  Lonia Blood, M.D.       DATE OF BIRTH:  Sep 21, 1940   DATE OF ADMISSION:  12/15/2005  DATE OF DISCHARGE:                              HISTORY & PHYSICAL   PRIMARY CARE PHYSICIAN:  Dr. Marisue Brooklyn.   CHIEF COMPLAINT:  Passing blood per rectum.   HISTORY OF PRESENT ILLNESS:  Mrs. Calais is a 70 year old Caucasian woman  with history of diverticulosis who had five previous episodes of lower  gastrointestinal bleeding, who presents to [facility]  Emergency Room after a recent onset of passing blood clots in her bowel  movements.  She reports three episode of bloody bowel movements today.  She denies any dizziness, weakness or fainting.  The patient denies any  vomiting, nausea or melena.   PAST MEDICAL HISTORY:  1. Diverticulosis.  2. Internal hemorrhoids.  3. Previous of five episodes of lower gastrointestinal bleeding.  4. Hypertension.  5. Depression.  6. Brain stem stroke in 2000.  7. Status post hysterectomy.  8. Status post multiple lumbar laminectomies.   HOME MEDICATIONS:  1. Cymbalta 120 mg daily.  2. Valium 10 mg three times a day.  3. Vasotec 5 mg twice a day.  4. Fiber daily.  5. Nu-Iron daily  6. Multivitamin daily.   SOCIAL HISTORY:  The patient lives with two daughters.  She is an avid  tobacco user, smokes a pack of cigarettes a day.  She does not drink any  alcohol.  She does not use any illicit drugs.  She is currently retired.   FAMILY HISTORY:  This patient's father died at age 93 with a stroke.  The patient's mother died at age 71 with dementia.  The patient has  three daughters living, they are healthy.   REVIEW OF SYSTEMS:  As per HPI, otherwise all other systems are  negative.  Mainly, the patient does not have any chest pain, shortness  of  breath, focal weakness or numbness dysuria or balance problems  currently.   PHYSICAL EXAMINATION UPON ADMISSION:  VITAL SIGNS:  Temperature 96.6,  blood pressure 166/108, pulse 92, respirations 12 saturation 96% on room  air.  GENERAL APPEARANCE:  Well-developed, well-nourished Caucasian woman in  no acute distress lying on the stretcher, alert, oriented to place,  person and time.  HEENT:  Head is normocephalic, atraumatic.  Eyes:  Pupils equal, round,  react to light and accommodation.  Extraocular movements intact.  Conjunctivae are pale.  Sclerae anicteric.  Throat is clear.  NECK:  Supple.  No JVD.  No carotid bruits.  CHEST:  Clear to auscultation bilaterally without wheeze, rhonchi or  crackles.  HEART:  Regular rate and rhythm without murmurs, rubs or gallops.  ABDOMEN:  Soft, nontender, nondistended and bowel sounds  are present.  There is no palpable hepatosplenomegaly.  LOWER EXTREMITIES:  No edema.  SKIN:  Warm and dry without any suspicious rashes.  NEUROLOGICAL:  Cranial nerves III-XII seem to be intact.  Sensation  intact in all four extremities and strength 5/5 all four extremities.   LABORATORY VALUES:  On admission white blood cell count 7.5, hemoglobin  13.8, platelet count 217.  PT 13, PTT 31.  Sodium 136, potassium 4.3,  chloride 102, bicarb 28, glucose 101, BUN 17, creatinine 1.4, calcium  9.3, albumin 3.6, AST 19, ALT 11, alkaline phosphatase 87, lipase 34.   ASSESSMENT/PLAN:  1. Lower gastrointestinal bleeding:  At this point in time it does not      seem to be catastrophic in nature.  I will place the patient on      telemetry and I will obtain a complete blood count every 12 hours.      Depending on the number of stools and if she deteriorates      hemodynamically, we will transfuse packed red blood cells as      needed.  The patient is currently not on any nonsteroidals or      aspirin so there is no need to stop any of those medications.  2. Ongoing  tobacco abuse:  I had a long discussion with the patient      and her family about the importance of quitting tobacco.  I also      provide the patient with a nicotine patch here in the hospital and      encouraged her to quit smoking and get counseling for that.  3. Depression, anxiety:  I will continue for now the Valium unchanged      and I will decrease the dose of Cymbalta as SSRIs may trigger more      gastrointestinal bleeding.  4. Hypertension:  For now, since the patient is actively bleeding, I      will hold the ACE inhibitor until the situation clears itself and      then resume the ACE inhibitors at time of discharge.  5. Deep venous thrombosis prophylaxis will be done using PAS hose.      Lonia Blood, M.D.  Electronically Signed     SL/MEDQ  D:  12/15/2005  T:  12/16/2005  Job:  [id number]   cc:   Lovenia Kim, D.O.  Fax: [phone]   [facility]

## 2010-06-06 NOTE — H&P (Signed)
[facility]  Patient:    Amy Moss, Amy Moss                       MRN: [id number] Adm. Date:  [id number] Attending:  Thyra Breed                         History and Physical  FOLLOWUP EVALUATION  HISTORY OF PRESENT ILLNESS:  This is a redictation of a note from Jun 16, 2000.  Amy Moss came in for followup on Jun 16, 2000, and we cannot find the dictation; I am redictating the note.  On that day, she stated she was not doing well.  She stated that she continued to have lower back pain radiating out into the right hip and down into the sciatic nerve distribution. It was improved by heat to lower back and hip and made worse by walking, sitting, and standing.  She has suffered from considerable constipation since her last visit.  CURRENT MEDICATIONS: 1. Methadone 5 mg 1 p.o. q.6h. 2. Pamelor 25 mg at night. 3. Zoloft 50 mg. 4. Premarin. 5. Prevacid. 6. Diazepam. 7. Stool softener. 8. Zestril.  PHYSICAL EXAMINATION:  VITAL SIGNS:  Blood pressure 131/74, heart rate 92, respiratory rate 16, and O2 saturation 96%.  Pain level 8/10.  NEUROLOGIC:  Her neurologic exam was unchanged from her visit of May 18, 2000.  IMPRESSION: 1. 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 2. Other medical problems per Dr. Theda Belfast.  DISPOSITION: 1. We will go ahead and apply for [facility]. 2. Increase methadone to 10 mg 1 p.o. q.8h., #90. 3. Continue other medications. 4. Followup with me in 4 weeks. DD:  07/05/00 TD:  07/05/00 Job: [id number]

## 2010-06-06 NOTE — Discharge Summary (Signed)
NAME:  Amy Moss, Amy Moss                          ACCOUNT NO.:  [id number]   MEDICAL RECORD NO.:  [id number]                   PATIENT TYPE:  INP   LOCATION:  0377                                 FACILITY:  [facility]   PHYSICIAN:  Elliot Cousin, M.D.                 DATE OF BIRTH:  01/31/1940   DATE OF ADMISSION:  12/20/2002  DATE OF DISCHARGE:  12/29/2002                                 DISCHARGE SUMMARY   PRIMARY DISCHARGE DIAGNOSES:  1. Hematochezia.  2. Normocytic anemia, status post two units of packed red blood cells     transfusion.  3. Diverticulosis/internal and external hemorrhoids/melanosis coli per     colonoscopy on December 22, 2002, per Dr. Leone Payor.  4. Status post fall secondary to near syncope and dysequilibrium.  5. Chronic orthostatic hypotension.  6. Hypertension.  7. Chronic low back pain/sacral pain.     a. Radiograph of the sacrum and coccyx revealed a suspicion of a non-        displaced inferior sacral fracture at approximately the level of S4 to        S5.  8. Escherichia coli urinary tract infection.  9. Dysphagia.   SECONDARY DISCHARGE DIAGNOSES:  1. Diverticulosis with a gastrointestinal bleed in 1994, with a negative     colonoscopy other than the diverticulosis.  2. 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  3. Status post multiple laminectomies and diskectomies in the past.  4. Chronic pain, on chronic Duragesic and chronic Percocet.  5. Depression.   DISCHARGE MEDICATIONS:  1. Ferrous sulfate 325 mg b.i.d.  2. Senokot S t.i.d. for constipation.  3. Diazepam 5 mg every eight hours.  4. Altace 1.25 mg one daily.  5. Duragesic patch 50 mcg change every 72 hours.  6. Reglan 5 mg q.a.c., 30 minutes before breakfast, lunch, and dinner.  7. Percocet 5 mg one every six hours as needed for pain.  8. Lexapro 20 mg q.h.s.   DISCHARGE DISPOSITION:  Amy Moss was discharged to home  on December 29, 2002, in improved and stable condition.  She has a follow up appointment  with Dr. Susann Givens on Thursday, January 04, 2003, at 2:15 p.m.  She will also  receive home physical therapy and occupational therapy, as well as an  assessment with the registered nurse to assess the patient's blood  pressures.   CONSULTATIONS:  1. Dr. Stan Head.  2. Occupational therapy/physical therapy.   PROCEDURES PERFORMED:  Colonoscopy per Dr. Leone Payor on December 22, 2002.  The  results revealed diverticulosis in the ascending colon to the sigmoid colon.  Not bleeding.  Internal and external hemorrhoids, grade 2.  Not bleeding.  Not thrombosed.  Melanosis.   HISTORY OF PRESENT ILLNESS:  Amy Moss is a 70 year old lady with a past  medical history significant for  a prior GI bleed secondary to diverticulosis  approximately 10 years ago, status post colonoscopy at that time with  confirmation.  No further rectal bleeding since that time.  The patient also  has a history of a brain stem stroke in 2000, with residual orthostatic  hypotension.  The patient presented after approximately 12 hours of  dizziness and two episodes of maroon colored blood per rectum.  The patient  states that up earlier in the evening she developed abdominal cramping in  the lower left quadrant with passage of gas, and then going to the bathroom  with urgency she noticed maroon blood on the paper in a minimal amount.  This was also accompanied by dizziness.  She returned to the bed, and later  in the evening she had another episode.  Her daughter came over and found  the patient on the floor between the commode and the wall.  The toilet bowl  was full of maroon blood and spots both on her pajamas and in the toilet  bowl.  The patient had no loss of consciousness, but did fall onto her  backside and did complain of left hip pain and coccygeal pain.  It was  unclear at the time whether she had a near syncopal episode  or a complete  loss of consciousness.  She gives a vague history regarding this.  Her bowel  movements were bloody and watery, and she stated that this event was similar  to her diverticular bleed in 1994.  When she was evaluated initially in the  emergency department her hemoglobin was 12.3, and she was hemodynamically  stable.   HOSPITAL COURSE:  #1 -  HEMATOCHEZIA/LOWER GASTROINTESTINAL BLEED:  The  initial management included volume repletion with normal saline, empiric  treatment with Protonix 40 mg IV daily, guiacing the patient's stools,  monitoring the patient's hemoglobin and hematocrit, and obtaining a  gastroenterology consult.  The patient's hemoglobin and hematocrit were  followed every 12 hours x4.  As mentioned above, the hemoglobin on admission  was 12.3, however, over the course of 24 hours, it fell to a nadir of 8.3.  The patient was typed and crossed and transfused 2 units of packed red blood  cells.  The followup hemoglobin improved to 11.3.  Iron studies were ordered  and revealed a total iron of 80, and a ferritin of 26, which is consistent  with iron-deficiency anemia.  The reticulocyte count percentage was 2.3, RBC  was 3.35 which was low, and the absolute reticulocyte count was 77.1, within  normal limits.  The patient's initial stool was negative per Hemoccult.  The  patient's INR was within normal limits at 1.1, and PTT was within normal  limits at 29.   Dr. Leone Payor, gastroenterologist, evaluated the patient.  He recommended the  colonoscopy.  The patient was therefore prepped with MiraLax.  The  colonoscopy was performed on December 22, 2002.  It revealed no evidence of  ongoing bleeding;  however, internal and external hemorrhoids were present  as well as diffuse diverticulosis spanning the ascending colon to the  sigmoid colon.  There was also evidence of melanosis coli.  The patient was treated with MiraLax laxatives during the hospital course thereafter.   The  patient remained hemodynamically stable throughout the remainder of the  hospital course.  Her stools were not bloody;  however, she did have some  dark colored stools during the remainder of the hospital course.  A C. diff  toxin was ordered  on admission by Dr. Donald Siva secondary to the patient's  stools being watery and bloody.  The C. diff toxin as well as the ova and  parasites tests were both negative.  The patient's hemoglobin prior to  hospital discharge was maintained at 10.2.  She will be treated with ferrous  sulfate  325 mg b.i.d., and as needed laxative therapy either with MiraLax or with  Senokot S.  She had no further bleeding during the remainder of the hospital  course.  #2 -  STATUS POST FALL SECONDARY TO PRESYNCOPE AND DYSEQUILIBRIUM:  As  stated above, the patient had a near syncopal episode while she was in the  bathroom.  It is not certain if the patient had the presyncopal episode  before or after the bowel movement.  The patient did complain of a great  deal of low back pain and sacrococcygeal pain during the hospital course.  A  radiograph was ordered for evaluation.  The radiograph was negative for left  hip fracture or dislocation.  It was also negative for coccygeal fractures  of dislocations;  however, it was suspicious for a non-displaced sacral  fracture.  The patient was treated with a Duragesic patch at 50 mcg every 72  hours, along with diazepam for muscle spasms, and breakthrough pain  medication with Percocet.  The patient also received physical therapy and  occupational therapy for gait, balance, and ambulation.  It is also  important to note that the patient was assessed for orthostatic hypotension.  Apparently, she has a history of orthostatic hypotension secondary to the  brain stem stroke in 2000.  She was indeed orthostatic with both blood  pressure and heart rate during the hospital course.  Her blood pressure fell  approximately 20 to 30 mmHg  on average during the hospital course.  She was  treated for several days with labetalol secondary to elevated blood  pressures;  however, the labetalol was discontinued following the worsening  orthostatic hypotension.  The patient was given gentle fluids following the  aggressive fluid volume repletion during initial hospital course.  She  ambulated with physical therapy and occupational therapy, instituting  measures to avoid dizziness and syncope when arising from the sitting or the  supine position.  #3 -  HYPERTENSION:  The patient's systolic blood pressures began to rise  into the 160s systolically.  As stated above, the patient was treated  initially with labetalol 100 mg b.i.d., however, this was later stopped  secondary to worsening orthostatic changes.  The patient was therefore  subsequently treated with Altace 1.25 mg daily.  Her blood pressures prior to hospital discharge ranged between 140 and 150 systolically.  Further  management will be deferred to Dr. Susann Givens.  #4 -  URINARY TRACT INFECTION SECONDARY TO ESCHERICHIA COLI:  The patient's  initial white blood cell count was 17.8.  This was felt to be in part  secondary to a reactive leukocytosis.  However, urine culture was ordered  for assessment.  It eventually grew out greater than 100,000 colonies of E.  coli.  The patient was treated with ciprofloxacin for seven days during the  hospital course.  #5 -  DYSPHAGIA:  The patient had some intermittent difficulty swallowing  with solid foods over the course of the hospitalization.  This was a mild  complaint.  However, the patient states that she has had dysphagia in the  past and had been treated with Reglan in the past.  This was subsequently  stopped  because of improved symptoms.  The patient was treated with Reglan 5  mg q.a.c. and q.h.s. during the hospital course.  The dysphagia disappeared.  She will be sent home on Reglan for ongoing treatment.                                                Elliot Cousin, M.D.    DF/MEDQ  D:  12/31/2002  T:  12/31/2002  Job:  [id number]   cc:   Sharlot Gowda, M.D.  [address] [place], [place] [address]  Fax: [phone]

## 2010-06-06 NOTE — Discharge Summary (Signed)
NAME:  Amy Moss, Amy Moss NO.:  [id number]   MEDICAL RECORD NO.:  [id number]          PATIENT TYPE:  INP   LOCATION:  3038                         FACILITY:  [facility]   PHYSICIAN:  Hillery Aldo, M.D.   DATE OF BIRTH:  07-26-1940   DATE OF ADMISSION:  01/15/2006  DATE OF DISCHARGE:                               DISCHARGE SUMMARY   DISCHARGE DIAGNOSES:  1. Left lower lobe pneumonia.  2. Failure to thrive.  3. Hypertension.  4. Anemia of chronic disease.  5. Weakness/deconditioning.  6. Urinary incontinence.  7. Depression.  8. Acute renal insufficiency.  9. Chronic fracture of the left shoulder with chronic left shoulder      pain.  10.History of diverticulosis.  11.History of internal hemorrhoids.  12.History of CVA.   DISCHARGE MEDICATIONS:  To be dictated at the time of actual discharge.   CONSULTATIONS:  None.   PROCEDURES AND DIAGNOSTIC STUDIES.:  1. Chest x-ray on January 15, 2006 showed no substantial interval      change in left basilar air space disease.  2. CT scan of the chest on January 15, 2006 showed left lower lobe      air space disease most compatible with pneumonia.  Additional      patchy ground glass opacities were present in both upper lobes,      likely inflammatory as well.  No adenopathy or pleural effusion.      Atypical neoplasm not excluded and radiographic follow-up is      recommended to document clearing.  Atrophy of the left kidney.  3. Chest x-ray on January 19, 2006 showed improving left lower lobe      pneumonia.  Radiographic resolution is expected to take 4-6 weeks      time.   DISCHARGE LABORATORY VALUES:  To be dictated at the time of actual  discharge.   BRIEF ADMISSION HISTORY OF PRESENT ILLNESS:  For the full details of the  HPI, please see the dictated note done by Dr. Lovell Sheehan.  Briefly, this is  a 70 year old female who had recently been hospitalized from January 07, 2006 through January 12, 2006 and who  returned on December 28 with  complaints of ongoing problems with weakness, productive cough and  anorexia.  She had previously been diagnosed with a left lower lobe  pneumonia and was discharged on a course of p.o. Avelox.  Apparently,  the patient did not complete her outpatient course of p.o. antibiotics.  She was admitted for further evaluation and workup.   HOSPITAL COURSE:  1. Left lower lobe pneumonia.  The patient was admitted and      empirically put back on Rocephin and azithromycin.  She completed 4      days of treatment with azithromycin and Rocephin and was      transitioned back over to p.o. Avelox.  Her white blood cell count      was normal.  Chest x-ray does document some clearing of the      pneumonia.  Due to her CT scan findings as noted above, she should  have follow-up to ensure complete clearing in 4-6 weeks time.  2. Hypertension:  The patient's blood pressure has remained well-      controlled on her usual regimen.  However, because of renal      insufficiency, on January 19, 2006, we are holding her ACE      inhibitor.  If her renal function improves, this can be restarted.  3. Anemia of chronic disease:  The patient did have iron studies done.      She was found to have findings most consistent with anemia of      chronic disease and low iron stores.  Her total iron was 35, total      iron binding capacity 220 and 16% saturation.  Her ferritin was 93.      B12 was 715. Her RBC folate was 9.  She was put on iron      supplementation to address her low iron stores.  4. Urinary incontinence:  The patient had significant urinary      incontinence in the course of her hospitalization.  She was put on      Detrol LA to address this and she has had much better bladder      control since.  She should be discharged on this medicine.  5. Weakness/deconditioning/failure to thrive.  The patient has been      seen by physical therapy and occupational therapy who  recommend      ongoing physical and occupational therapy after discharge in the      home.  6. Depression.  The patient was continued on her usual dose of      Cymbalta.  7. Acute renal insufficiency:  The patient's creatinine bumped from 1      to 1.4 on January 19, 2006.  This happened after starting      hydrochlorothiazide.  Given this, we will discontinue the      hydrochlorothiazide and hold her ACE inhibitor and monitor her      renal function for resolution.   DISPOSITION:  It is expected that the patient will be discharged home in  the next 24-48 hours.  At that time, discharge summary addendum will be  dictated with the patient's discharge medications.      Hillery Aldo, M.D.  Electronically Signed     CR/MEDQ  D:  01/19/2006  T:  01/19/2006  Job:  [id number]
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
## 2010-06-06 NOTE — Discharge Summary (Signed)
NAME:  Amy Moss, Amy Moss                ACCOUNT NO.:  [id number]   MEDICAL RECORD NO.:  [id number]          PATIENT TYPE:  INP   LOCATION:  1420                         FACILITY:  [facility]   PHYSICIAN:  Isidor Holts, M.D.  DATE OF BIRTH:  08-23-1940   DATE OF ADMISSION:  12/15/2005  DATE OF DISCHARGE:  12/18/2005                               DISCHARGE SUMMARY   PRIMARY CARE Palmina Clodfelter:  Lovenia Kim, D.O.   PRIMARY GASTROENTEROLOGIST:  Everardo Beals. Juanda Chance, M.D.   DISCHARGE DIAGNOSES:  1. Lower gastrointestinal bleed, presumed secondary to diverticular      disease.  2. Acute on chronic blood loss anemia.  3. History of diverticulosis/internal hemorrhoids.  4. Hypertension.  5. History of depression.  6. History of previous cerebrovascular accident in 2000.   DISCHARGE MEDICATIONS:  1. Cymbalta 120 mg p.o. daily.  2. Valium 10 mg p.o. t.i.d.  3. Vasotec 5 mg p.o. b.i.d.  4. Nu-Iron 150 mg p.o. daily.  5. Multivitamin one p.o. daily.   PROCEDURES:  None.   CONSULTATIONS:  Barbette Hair. Arlyce Dice, MD, gastroenterologist.   ADMISSION HISTORY:  As in H&P notes of December 15, 2005, dictated by  Lonia Blood, MD.  However, in brief, this is a 70 year old lady, with  known medical history of diverticulosis and internal hemorrhoids, status  post multiple previous episodes of lower GI bleeding, hypertension,  depression, brainstem CVA in 2000, chronic low back pain status post  multiple lumbar laminectomies, who presents after 3 episodes of passage  of blood per rectum on day of presentation.  She was admitted for  further evaluation, investigation and management.   CLINICAL COURSE:  Problem 1.  LOWER GASTROINTESTINAL BLEED:  This is deemed likely  secondary to recurrent diverticular bleed.  The patient reportedly  underwent colonoscopy May 30, 2005, by Dr. Lina Sar, and this  demonstrated extensive diverticulosis involving the right and left  colon.  Against this background, it was felt  pertinent to request a GI  consultation.  This was done and was kindly provided by Dr. Melvia Heaps, who recommended conservative management.  Apparently the  question of possible surgery was mooted, and the patient was not  interested in a surgical solution.  The patient was initially kept  n.p.o., started on intravenous fluids, serial hemoglobin/hematocrit was  monitored.  However, hemoglobin remained stable.  As a matter of fact,  on December 18, 2005, hemoglobin was 11.7 with a hematocrit of 34.2.  gradually the patient's hematochezia subsided.  She became asymptomatic,  was able to tolerate a regular diet, and per GI recommendations, she was  deemed clinically stable to be discharged on December 18, 2005, for  follow-up with her primary gastroenterologist, Dr. Lina Sar.   Problem 2.  ANEMIA:  This is acute on chronic blood loss anemia,  secondary to recurrent lower GI bleeds secondary to diverticular  disease.  The patient's hemoglobin remained stable/reasonable during  this hospitalization.  She has been recommended to continue with her  iron supplements.   Problem 3.  HYPERTENSION:  This was adequately controlled on patient's  pre-admission dosage of  Vasotec.   Problem 4.  HISTORY OF DEPRESSION:  The patient's mood remained stable,  throughout this hospitalization.   Problem 5.  HISTORY OF CHRONIC LOW BACK PAIN:  This did not prove  problematic.  The patient required a couple of doses of analgesics, but  otherwise has remained stable from this viewpoint.   DISPOSITION:  The patient was considered sufficiently recovered and  clinically stable to be discharged on December 18, 2005.   DIET:  A high-fiber diet.   ACTIVITY:  As tolerated.   WOUND CARE:  Not applicable.   PAIN MANAGEMENT:  Not applicable.   FOLLOW-UP INSTRUCTIONS:  The patient is instructed to follow up with her  primary gastroenterologist, Dr. Lina Sar, within one week of  discharge.  She has been  instructed to call for an appointment.  She is  also to follow up with her primary MD, Dr. Marisue Brooklyn, routinely,  i.e., per prior scheduled appointment.  All this has been communicated  to the patient.  She has verbalized understanding.      Isidor Holts, M.D.  Electronically Signed     CO/MEDQ  D:  12/18/2005  T:  12/18/2005  Job:  [id number]   cc:   Lovenia Kim, D.O.  Fax: [phone]   Hedwig Morton. Juanda Chance, MD  [address]  [place]  [place] [address]

## 2010-10-28 LAB — CARDIAC PANEL(CRET KIN+CKTOT+MB+TROPI)
CK, MB: 0.7
CK, MB: 0.8
Relative Index: INVALID
Total CK: 40
Troponin I: 0.02

## 2010-10-28 LAB — CBC
HCT: 36.2
Hemoglobin: 12.3
Hemoglobin: 13.6
MCHC: 33.9
MCV: 88.8
Platelets: 197
RDW: 14.7 — ABNORMAL HIGH
WBC: 8.6

## 2010-10-28 LAB — DIFFERENTIAL
Basophils Relative: 0
Eosinophils Relative: 2
Lymphs Abs: 1.4
Monocytes Relative: 5
Neutrophils Relative %: 76

## 2010-10-28 LAB — BASIC METABOLIC PANEL
Chloride: 106
Potassium: 3.8
Sodium: 137

## 2010-10-28 LAB — POCT CARDIAC MARKERS
CKMB, poc: <1 — ABNORMAL LOW
Troponin i, poc: <0.05

## 2010-10-28 LAB — COMPREHENSIVE METABOLIC PANEL
ALT: 9
Albumin: 3.3 — ABNORMAL LOW
Alkaline Phosphatase: 93
BUN: 19
Creatinine, Ser: 1.66 — ABNORMAL HIGH
Glucose, Bld: 94
Potassium: 4.8
Sodium: 136
Total Bilirubin: 0.5
Total Protein: 6.6

## 2010-10-28 LAB — URINALYSIS, MICROSCOPIC ONLY
Bilirubin Urine: NEGATIVE
Glucose, UA: NEGATIVE
Hgb urine dipstick: NEGATIVE
Nitrite: NEGATIVE
Protein, ur: NEGATIVE
Urobilinogen, UA: 1

## 2010-10-28 LAB — CK TOTAL AND CKMB (NOT AT ARMC): CK, MB: 0.8

## 2010-10-28 LAB — TSH: TSH: 1.117

## 2010-11-05 LAB — POCT HEMOGLOBIN-HEMACUE: Hemoglobin: 14.8

## 2010-11-05 LAB — BASIC METABOLIC PANEL
CO2: 25
Chloride: 104
GFR calc Af Amer: 49 — ABNORMAL LOW
Potassium: 4.8
Sodium: 134 — ABNORMAL LOW

## 2011-03-20 ENCOUNTER — Other Ambulatory Visit: Payer: Self-pay | Admitting: Internal Medicine

## 2011-03-20 DIAGNOSIS — I714 Abdominal aortic aneurysm, without rupture: Secondary | ICD-10-CM

## 2011-03-24 ENCOUNTER — Other Ambulatory Visit: Payer: Medicare Other

## 2011-03-30 ENCOUNTER — Ambulatory Visit
Admission: RE | Admit: 2011-03-30 | Discharge: 2011-03-30 | Disposition: A | Discharge status: Home / Self Care | Payer: Medicare Other | Source: Ambulatory Visit | Attending: Internal Medicine | Admitting: Internal Medicine

## 2011-03-30 DIAGNOSIS — I714 Abdominal aortic aneurysm, without rupture: Secondary | ICD-10-CM

## 2011-09-09 ENCOUNTER — Encounter (HOSPITAL_COMMUNITY): Payer: Self-pay | Admitting: *Deleted

## 2011-09-09 ENCOUNTER — Emergency Department (HOSPITAL_COMMUNITY)
Admission: EM | Admit: 2011-09-09 | Discharge: 2011-09-10 | Disposition: A | Discharge status: Home / Self Care | Payer: Medicare Other | Attending: Emergency Medicine | Admitting: Emergency Medicine

## 2011-09-09 ENCOUNTER — Emergency Department (HOSPITAL_COMMUNITY): Payer: Medicare Other

## 2011-09-09 ENCOUNTER — Emergency Department (HOSPITAL_COMMUNITY)
Admission: EM | Admit: 2011-09-09 | Discharge: 2011-09-09 | Discharge status: Against Medical Advice | Payer: Medicare Other | Source: Home / Self Care

## 2011-09-09 DIAGNOSIS — E7800 Pure hypercholesterolemia, unspecified: Secondary | ICD-10-CM | POA: Insufficient documentation

## 2011-09-09 DIAGNOSIS — M25539 Pain in unspecified wrist: Secondary | ICD-10-CM | POA: Insufficient documentation

## 2011-09-09 DIAGNOSIS — S52599A Other fractures of lower end of unspecified radius, initial encounter for closed fracture: Secondary | ICD-10-CM | POA: Insufficient documentation

## 2011-09-09 DIAGNOSIS — Z8673 Personal history of transient ischemic attack (TIA), and cerebral infarction without residual deficits: Secondary | ICD-10-CM | POA: Insufficient documentation

## 2011-09-09 DIAGNOSIS — W19XXXA Unspecified fall, initial encounter: Secondary | ICD-10-CM | POA: Insufficient documentation

## 2011-09-09 DIAGNOSIS — F17200 Nicotine dependence, unspecified, uncomplicated: Secondary | ICD-10-CM | POA: Insufficient documentation

## 2011-09-09 DIAGNOSIS — S52509A Unspecified fracture of the lower end of unspecified radius, initial encounter for closed fracture: Secondary | ICD-10-CM

## 2011-09-09 DIAGNOSIS — Z79899 Other long term (current) drug therapy: Secondary | ICD-10-CM | POA: Insufficient documentation

## 2011-09-09 DIAGNOSIS — I10 Essential (primary) hypertension: Secondary | ICD-10-CM | POA: Insufficient documentation

## 2011-09-09 HISTORY — DX: Cerebral infarction, unspecified: I63.9

## 2011-09-09 HISTORY — DX: Pure hypercholesterolemia, unspecified: E78.00

## 2011-09-09 HISTORY — DX: Essential (primary) hypertension: I10

## 2011-09-09 LAB — CBC WITH DIFFERENTIAL/PLATELET
Basophils Absolute: 0 10*3/uL (ref 0.0–0.1)
HCT: 44.9 % (ref 36.0–46.0)
Hemoglobin: 15.4 g/dL — ABNORMAL HIGH (ref 12.0–15.0)
Lymphocytes Relative: 26 % (ref 12–46)
Monocytes Absolute: 1 10*3/uL (ref 0.1–1.0)
Monocytes Relative: 8 % (ref 3–12)
Neutro Abs: 7.8 10*3/uL — ABNORMAL HIGH (ref 1.7–7.7)
RBC: 4.83 MIL/uL (ref 3.87–5.11)
RDW: 13 % (ref 11.5–15.5)
WBC: 12.1 10*3/uL — ABNORMAL HIGH (ref 4.0–10.5)

## 2011-09-09 LAB — BASIC METABOLIC PANEL
CO2: 21 mEq/L (ref 19–32)
Chloride: 99 mEq/L (ref 96–112)
Creatinine, Ser: 1.04 mg/dL (ref 0.50–1.10)

## 2011-09-09 MED ORDER — ONDANSETRON HCL 4 MG/2ML IJ SOLN
4.0000 mg | Freq: Once | INTRAMUSCULAR | Status: AC
  Administered 2011-09-09: 4 mg via INTRAVENOUS
  Filled 2011-09-09: qty 2

## 2011-09-09 MED ORDER — HYDROMORPHONE HCL PF 1 MG/ML IJ SOLN
0.5000 mg | Freq: Once | INTRAMUSCULAR | Status: AC
  Administered 2011-09-09: 0.5 mg via INTRAVENOUS
  Filled 2011-09-09: qty 1

## 2011-09-09 NOTE — ED Notes (Signed)
Pt reports she felt dizzy and fell in front yard and fell landing on buttock and right wrist approximately at 1530. Pt was seen at [facility] and x-ray completed, but patient and family were unwilling to wait any longer and came to [place].  Pt has not taken anything for pain.  Pt reports right wrist pain. Pt denies loss of consciousness or hitting her head.  Noted HTN on assessment.

## 2011-09-09 NOTE — ED Notes (Signed)
WUJ:[id number]<JY> Expected date:<BR> Expected time:<BR> Means of arrival:<BR> Comments:<BR> Hold for FT 6

## 2011-09-09 NOTE — ED Notes (Signed)
PT was letting pittbull out and fell down going up hill.  PT was dizzy and that is what caused her to fall.  PT usually takes diazepam for vertigo and anxiety.  No head injury.

## 2011-09-09 NOTE — ED Notes (Signed)
DR Effie Shy made aware of pt's BP

## 2011-09-09 NOTE — ED Notes (Cosign Needed)
Medical screening exam performed. Initial labs ordered. Pain medicine ordered.  Patient was seen at [facility] this afternoon and had an x-ray performed of left prior to being seen and T2-weighted times.  Patient states that she was lightheaded and fell because of her lightheadedness. She fell onto an outstretched right wrist and complaints currently of pain. X-ray shows fracture. Patient is hypertensive in emergency department. She denies headache, nausea or vomiting. She denies chest pain.  7:57 PM Exam: Gen NAD; Heart RRR, nml S1,S2, no m/r/g; Lungs CTAB; Abd soft, NT, no rebound or guarding; Ext 2+ radial pulses bilaterally, no edema, tenderness over distal radius.    Renne Crigler, [place] 09/09/11 1958

## 2011-09-09 NOTE — ED Provider Notes (Signed)
History     CSN: [id number]  Arrival date & time 09/09/11  1909   First MD Initiated Contact with Patient 09/09/11 1950      Chief Complaint  Patient presents with  . Wrist Pain  . Hypertension    (Consider location/radiation/quality/duration/timing/severity/associated sxs/prior treatment) HPI Comments: Amy Moss is a 71 y.o. Female who was walking outside in like grass, slipped and fell. She injured her right arm. She denies headache, weakness, or dizziness. She did not try medication prior to coming here. She was seen at [facility]. Initially, waited in the waiting room, and came here after receiving fentanyl.  Her daughter, is with her states that she is acting her usual self.  The history is provided by the patient.    Past Medical History  Diagnosis Date  . Hypertension   . Stroke   . Hypercholesterolemia     Past Surgical History  Procedure Date  . Back surgery     No family history on file.  History  Substance Use Topics  . Smoking status: Current Everyday Smoker  . Smokeless tobacco: Not on file  . Alcohol Use: No    OB History    Grav Para Term Preterm Abortions TAB SAB Ect Mult Living                  Review of Systems  All other systems reviewed and are negative.    Allergies  Review of patient's allergies indicates no known allergies.  Home Medications   Current Outpatient Rx  Name Route Sig Dispense Refill  . DIAZEPAM 10 MG PO TABS Oral Take 10 mg by mouth 3 (three) times daily.    . ENALAPRIL MALEATE 20 MG PO TABS Oral Take 20 mg by mouth 2 (two) times daily.    Marland Kitchen METOPROLOL TARTRATE 50 MG PO TABS Oral Take 50 mg by mouth 2 (two) times daily.    . TRAMADOL HCL 50 MG PO TABS Oral Take 50 mg by mouth 2 (two) times daily as needed. pain    . HYDROCODONE-ACETAMINOPHEN 5-325 MG PO TABS Oral Take 1 tablet by mouth every 4 (four) hours as needed for pain. 20 tablet 0    BP 184/76  Pulse 82  Temp 97.7 F (36.5 C) (Oral)  Resp 13   Ht 5\' 7"  (1.702 m)  Wt 131 lb (59.421 kg)  BMI 20.52 kg/m2  SpO2 93%  Physical Exam  Nursing note and vitals reviewed. Constitutional: She is oriented to person, place, and time. She appears well-developed and well-nourished.  HENT:  Head: Normocephalic and atraumatic.  Eyes: Conjunctivae and EOM are normal. Pupils are equal, round, and reactive to light.  Neck: Normal range of motion and phonation normal. Neck supple.  Cardiovascular: Normal rate, regular rhythm and intact distal pulses.   Pulmonary/Chest: Effort normal and breath sounds normal. She exhibits no tenderness.  Abdominal: Soft. She exhibits no distension. There is no tenderness. There is no guarding.  Musculoskeletal:       Right wrist, tender, and swollen, radial aspect. Mildly limited range of motion. No gross deformity. Neurovascular intact distally in the right hand.  Neurological: She is alert and oriented to person, place, and time. She has normal strength. She exhibits normal muscle tone.  Skin: Skin is warm and dry.  Psychiatric: She has a normal mood and affect. Her behavior is normal. Judgment and thought content normal.    ED Course  Procedures (including critical care time)  . Splint applied  by orthopedic technician   Date: 09/09/2011  Rate: 71  Rhythm: atrial fibrillation  QRS Axis: normal  Intervals: normal  ST/T Wave abnormalities: normal  Conduction Disutrbances:Borderline Prolonged QT  Narrative Interpretation:   Old EKG Reviewed: changes noted- QT longer    Labs Reviewed  CBC WITH DIFFERENTIAL - Abnormal; Notable for the following:    WBC 12.1 (*)     Hemoglobin 15.4 (*)     Neutro Abs 7.8 (*)     All other components within normal limits  BASIC METABOLIC PANEL - Abnormal; Notable for the following:    Sodium 133 (*)     GFR calc non Af Amer 52 (*)     GFR calc Af Amer 61 (*)     All other components within normal limits  URINALYSIS, ROUTINE W REFLEX MICROSCOPIC - Abnormal; Notable for  the following:    Hgb urine dipstick TRACE (*)     All other components within normal limits  URINE MICROSCOPIC-ADD ON  LAB REPORT - SCANNED   Dg Wrist Complete Right  09/09/2011  *RADIOLOGY REPORT*  Clinical Data: Wrist pain, bruising and swelling status post fall today.  RIGHT WRIST - COMPLETE 3+ VIEW  Comparison: Forearm radiographs 07/26/2007.  Findings: The bones are demineralized.  There is an acute mildly impacted fracture of the distal radius with disruption of the dorsal cortex, most obvious on the lateral view.  The distal ulna appears intact.  There is no evidence of carpal bone fracture or dislocation.  There is chondrocalcinosis.  There is soft tissue swelling surrounding the wrist.  IMPRESSION: Mildly impacted acute intra-articular fracture of the distal radius with disruption of the posterior cortex as described.   Original Report Authenticated By: Gerrianne Scale, M.D.      1. Fall   2. Fracture of distal end of radius       MDM  Distal radius fracture, without angulation. Patient splint for stability. Referred to orthopedics.   Doubt metabolic instability, serious bacterial infection or impending vascular collapse; the patient is stable for discharge.    Plan: Home Medications- Norco; Home Treatments- elevation right hand; Recommended follow up- Ortho f/u 4-5 days        Flint Melter, MD 09/12/11 [phone]

## 2011-09-09 NOTE — ED Notes (Signed)
Splint to right FA by Ortho Tech. Medicated for pain prior to procedure. Family at the bedside. VS stable

## 2011-09-09 NOTE — ED Notes (Signed)
PA made aware of high blood pressures. PA currently at pt bedside.

## 2011-09-09 NOTE — ED Provider Notes (Signed)
History     CSN: [id number]  Arrival date & time 09/09/11  1909   First MD Initiated Contact with Patient 09/09/11 1950      Chief Complaint  Patient presents with  . Wrist Pain  . Hypertension    (Consider location/radiation/quality/duration/timing/severity/associated sxs/prior treatment) HPI  Past Medical History  Diagnosis Date  . Hypertension   . Stroke   . Hypercholesterolemia     Past Surgical History  Procedure Date  . Back surgery     No family history on file.  History  Substance Use Topics  . Smoking status: Current Everyday Smoker  . Smokeless tobacco: Not on file  . Alcohol Use: No    OB History    Grav Para Term Preterm Abortions TAB SAB Ect Mult Living                  Review of Systems  Allergies  Review of patient's allergies indicates no known allergies.  Home Medications   Current Outpatient Rx  Name Route Sig Dispense Refill  . DIAZEPAM 10 MG PO TABS Oral Take 10 mg by mouth 3 (three) times daily.    . ENALAPRIL MALEATE 20 MG PO TABS Oral Take 20 mg by mouth 2 (two) times daily.    Marland Kitchen METOPROLOL TARTRATE 50 MG PO TABS Oral Take 50 mg by mouth 2 (two) times daily.    . TRAMADOL HCL 50 MG PO TABS Oral Take 50 mg by mouth 2 (two) times daily as needed. pain      BP 223/99  Pulse 67  Temp 98.4 F (36.9 C) (Oral)  Resp 19  Ht 5\' 7"  (1.702 m)  Wt 131 lb (59.421 kg)  BMI 20.52 kg/m2  SpO2 97%  Physical Exam  ED Course  Procedures (including critical care time)   Labs Reviewed  CBC WITH DIFFERENTIAL  BASIC METABOLIC PANEL   Dg Wrist Complete Right  09/09/2011  *RADIOLOGY REPORT*  Clinical Data: Wrist pain, bruising and swelling status post fall today.  RIGHT WRIST - COMPLETE 3+ VIEW  Comparison: Forearm radiographs 07/26/2007.  Findings: The bones are demineralized.  There is an acute mildly impacted fracture of the distal radius with disruption of the dorsal cortex, most obvious on the lateral view.  The distal ulna appears  intact.  There is no evidence of carpal bone fracture or dislocation.  There is chondrocalcinosis.  There is soft tissue swelling surrounding the wrist.  IMPRESSION: Mildly impacted acute intra-articular fracture of the distal radius with disruption of the posterior cortex as described.   Original Report Authenticated By: Gerrianne Scale, M.D.      No diagnosis found.  MSE performed, see separate note.   MDM          Renne Crigler, PA 09/09/11 2029

## 2011-09-10 LAB — URINALYSIS, ROUTINE W REFLEX MICROSCOPIC
Glucose, UA: NEGATIVE mg/dL
Ketones, ur: NEGATIVE mg/dL
Leukocytes, UA: NEGATIVE
pH: 6.5 (ref 5.0–8.0)

## 2011-09-10 LAB — URINE MICROSCOPIC-ADD ON

## 2011-09-10 MED ORDER — HYDROMORPHONE HCL PF 1 MG/ML IJ SOLN
0.5000 mg | Freq: Once | INTRAMUSCULAR | Status: AC
  Administered 2011-09-10: 0.5 mg via INTRAVENOUS
  Filled 2011-09-10: qty 1

## 2011-09-10 MED ORDER — HYDROCODONE-ACETAMINOPHEN 5-325 MG PO TABS: 1.0000 | ORAL_TABLET | ORAL | Status: AC | PRN

## 2011-11-13 ENCOUNTER — Emergency Department (HOSPITAL_COMMUNITY)
Admission: EM | Admit: 2011-11-13 | Discharge: 2011-11-13 | Disposition: A | Discharge status: Home / Self Care | Payer: Medicare Other | Attending: Emergency Medicine | Admitting: Emergency Medicine

## 2011-11-13 ENCOUNTER — Emergency Department (HOSPITAL_COMMUNITY): Payer: Medicare Other

## 2011-11-13 ENCOUNTER — Encounter (HOSPITAL_COMMUNITY): Payer: Self-pay

## 2011-11-13 DIAGNOSIS — I10 Essential (primary) hypertension: Secondary | ICD-10-CM | POA: Insufficient documentation

## 2011-11-13 DIAGNOSIS — Z8719 Personal history of other diseases of the digestive system: Secondary | ICD-10-CM | POA: Insufficient documentation

## 2011-11-13 DIAGNOSIS — M549 Dorsalgia, unspecified: Secondary | ICD-10-CM | POA: Insufficient documentation

## 2011-11-13 DIAGNOSIS — E7800 Pure hypercholesterolemia, unspecified: Secondary | ICD-10-CM | POA: Insufficient documentation

## 2011-11-13 DIAGNOSIS — Z79899 Other long term (current) drug therapy: Secondary | ICD-10-CM | POA: Insufficient documentation

## 2011-11-13 DIAGNOSIS — F17200 Nicotine dependence, unspecified, uncomplicated: Secondary | ICD-10-CM | POA: Insufficient documentation

## 2011-11-13 DIAGNOSIS — I718 Aortic aneurysm of unspecified site, ruptured: Secondary | ICD-10-CM | POA: Insufficient documentation

## 2011-11-13 DIAGNOSIS — Z8673 Personal history of transient ischemic attack (TIA), and cerebral infarction without residual deficits: Secondary | ICD-10-CM | POA: Insufficient documentation

## 2011-11-13 HISTORY — DX: Abdominal aortic aneurysm, without rupture, unspecified: I71.40

## 2011-11-13 HISTORY — DX: Abdominal aortic aneurysm, without rupture: I71.4

## 2011-11-13 LAB — POCT I-STAT, CHEM 8
Calcium, Ion: 1.19 mmol/L (ref 1.13–1.30)
Glucose, Bld: 85 mg/dL (ref 70–99)
HCT: 40 % (ref 36.0–46.0)
Hemoglobin: 13.6 g/dL (ref 12.0–15.0)
TCO2: 22 mmol/L (ref 0–100)

## 2011-11-13 LAB — TYPE AND SCREEN
ABO/RH(D): O POS
Antibody Screen: NEGATIVE

## 2011-11-13 LAB — COMPREHENSIVE METABOLIC PANEL
ALT: 9 U/L (ref 0–35)
AST: 18 U/L (ref 0–37)
Alkaline Phosphatase: 77 U/L (ref 39–117)
CO2: 23 mEq/L (ref 19–32)
GFR calc Af Amer: 49 mL/min — ABNORMAL LOW (ref >90)
GFR calc non Af Amer: 42 mL/min — ABNORMAL LOW (ref >90)
Glucose, Bld: 89 mg/dL (ref 70–99)
Potassium: 3.9 mEq/L (ref 3.5–5.1)
Sodium: 137 mEq/L (ref 135–145)

## 2011-11-13 LAB — CBC WITH DIFFERENTIAL/PLATELET
Lymphocytes Relative: 30 % (ref 12–46)
Lymphs Abs: 2.2 10*3/uL (ref 0.7–4.0)
MCV: 93.7 fL (ref 78.0–100.0)
Neutro Abs: 4.4 10*3/uL (ref 1.7–7.7)
Neutrophils Relative %: 60 % (ref 43–77)
Platelets: 176 10*3/uL (ref 150–400)
RBC: 4.29 MIL/uL (ref 3.87–5.11)
WBC: 7.3 10*3/uL (ref 4.0–10.5)

## 2011-11-13 LAB — URINALYSIS, ROUTINE W REFLEX MICROSCOPIC
Bilirubin Urine: NEGATIVE
Hgb urine dipstick: NEGATIVE
Ketones, ur: NEGATIVE mg/dL
Nitrite: NEGATIVE
Urobilinogen, UA: 0.2 mg/dL (ref 0.0–1.0)
pH: 5.5 (ref 5.0–8.0)

## 2011-11-13 LAB — APTT: aPTT: 32 seconds (ref 24–37)

## 2011-11-13 MED ORDER — METHOCARBAMOL 500 MG PO TABS: 500.0000 mg | ORAL_TABLET | Freq: Two times a day (BID) | ORAL | Status: DC

## 2011-11-13 MED ORDER — IOHEXOL 350 MG/ML SOLN
100.0000 mL | Freq: Once | INTRAVENOUS | Status: AC | PRN
  Administered 2011-11-13: 100 mL via INTRAVENOUS

## 2011-11-13 MED ORDER — DIAZEPAM 5 MG PO TABS
5.0000 mg | ORAL_TABLET | Freq: Once | ORAL | Status: DC
  Filled 2011-11-13: qty 1

## 2011-11-13 MED ORDER — HYDROCODONE-ACETAMINOPHEN 5-325 MG PO TABS: 1.0000 | ORAL_TABLET | ORAL | Status: DC | PRN

## 2011-11-13 MED ORDER — MORPHINE SULFATE 4 MG/ML IJ SOLN
4.0000 mg | Freq: Once | INTRAMUSCULAR | Status: AC
  Administered 2011-11-13: 4 mg via INTRAVENOUS
  Filled 2011-11-13: qty 1

## 2011-11-13 MED ORDER — KETOROLAC TROMETHAMINE 30 MG/ML IJ SOLN
30.0000 mg | Freq: Once | INTRAMUSCULAR | Status: AC
  Administered 2011-11-13: 30 mg via INTRAVENOUS
  Filled 2011-11-13: qty 1

## 2011-11-13 MED ORDER — ONDANSETRON HCL 4 MG/2ML IJ SOLN
4.0000 mg | Freq: Once | INTRAMUSCULAR | Status: AC
  Administered 2011-11-13: 4 mg via INTRAVENOUS
  Filled 2011-11-13: qty 2

## 2011-11-13 NOTE — ED Notes (Signed)
AAA pain, back pain, high blood pressure, nothing to relieve pain. Not eating, sent by Dr. Deborha Payment office

## 2011-11-13 NOTE — ED Notes (Signed)
MD Cook at bedside. 

## 2011-11-13 NOTE — ED Provider Notes (Signed)
CT angiogram reviewed by self.  I called Dr. Hart Rochester the vascular surgeon on call. He was in the middle of an operation. He will followup as outpatient.  Patient's pain is clearly on the lumbar spine.  No clinical evidence suggesting the pain is secondary to her aneurysm. Discharge home on Vicodin #25 and Robaxin #20.  Encouraged to stop smoking  Donnetta Hutching, MD 11/13/11 2228

## 2011-11-13 NOTE — ED Provider Notes (Signed)
History     CSN: [id number]  Arrival date & time 11/13/11  1310   First MD Initiated Contact with Patient 11/13/11 1340      Chief Complaint  Patient presents with  . AAA  . Back Pain  . Hypertension    (Consider location/radiation/quality/duration/timing/severity/associated sxs/prior treatment) HPI Pt p/w 3-4 days of back pain. No radiation. No abd pain. Pt has known AAA. Pt states she has never had pain like this. No new weakness or sensory changes.  Past Medical History  Diagnosis Date  . Hypertension   . Stroke   . Hypercholesterolemia   . AAA (abdominal aortic aneurysm) without rupture     Past Surgical History  Procedure Date  . Back surgery     History reviewed. No pertinent family history.  History  Substance Use Topics  . Smoking status: Current Every Day Smoker  . Smokeless tobacco: Not on file  . Alcohol Use: No    OB History    Grav Para Term Preterm Abortions TAB SAB Ect Mult Living                  Review of Systems  Constitutional: Negative for fever and chills.  HENT: Negative for neck pain.   Respiratory: Negative for shortness of breath.   Cardiovascular: Negative for chest pain.  Gastrointestinal: Negative for nausea, vomiting and abdominal pain.  Genitourinary: Negative for dysuria and frequency.  Musculoskeletal: Positive for back pain. Negative for myalgias and arthralgias.  Skin: Negative for rash and wound.  Neurological: Positive for dizziness and light-headedness. Negative for syncope, weakness and numbness.    Allergies  Review of patient's allergies indicates no known allergies.  Home Medications   Current Outpatient Rx  Name Route Sig Dispense Refill  . ALBUTEROL SULFATE HFA 108 (90 BASE) MCG/ACT IN AERS Inhalation Inhale 2 puffs into the lungs every 6 (six) hours as needed. For shortness of breath    . VITAMIN B COMPLEX PO Oral Take 1 tablet by mouth daily.    Marland Kitchen VITAMIN D-3 PO Oral Take 1,200 Units by mouth daily.      Marland Kitchen DIAZEPAM 10 MG PO TABS Oral Take 10 mg by mouth every 8 (eight) hours. For anxiety    . ENALAPRIL MALEATE 20 MG PO TABS Oral Take 20 mg by mouth 2 (two) times daily.    Marland Kitchen METOPROLOL TARTRATE 50 MG PO TABS Oral Take 50 mg by mouth 2 (two) times daily.    . TRAMADOL HCL 50 MG PO TABS Oral Take 50 mg by mouth 2 (two) times daily as needed. For pain    . HYDROCODONE-ACETAMINOPHEN 5-325 MG PO TABS Oral Take 1 tablet by mouth every 4 (four) hours as needed for pain. 10 tablet 0  . HYDROCODONE-ACETAMINOPHEN 5-325 MG PO TABS Oral Take 1-2 tablets by mouth every 4 (four) hours as needed for pain. 25 tablet 0  . METHOCARBAMOL 500 MG PO TABS Oral Take 1 tablet (500 mg total) by mouth 2 (two) times daily. 20 tablet 0  . METHOCARBAMOL 500 MG PO TABS Oral Take 1 tablet (500 mg total) by mouth 2 (two) times daily. 20 tablet 0    BP 156/72  Pulse 60  Temp 98.1 F (36.7 C) (Oral)  Resp 17  SpO2 95%  Physical Exam  Nursing note and vitals reviewed. Constitutional: She is oriented to person, place, and time. She appears well-developed and well-nourished. No distress.  HENT:  Head: Normocephalic and atraumatic.  Mouth/Throat: Oropharynx is clear  and moist.  Eyes: EOM are normal. Pupils are equal, round, and reactive to light.  Neck: Normal range of motion. Neck supple.  Cardiovascular: Normal rate and regular rhythm.   Pulmonary/Chest: Effort normal and breath sounds normal. No respiratory distress. She has no wheezes. She has no rales.  Abdominal: Soft. Bowel sounds are normal. She exhibits mass (pulsitile mass lightly palpated in abdomen). She exhibits no distension. There is no tenderness. There is no rebound and no guarding.  Musculoskeletal: Normal range of motion. She exhibits no edema and no tenderness.       No midline or paraspinal TTP to thoracic or lumbar spine. Decreased DP pulse L foot, warm, good cap refill   Neurological: She is alert and oriented to person, place, and time.        Generalized weakness without focality. No sensory changes  Skin: Skin is warm and dry. No rash noted. No erythema.  Psychiatric: She has a normal mood and affect. Her behavior is normal.    ED Course  Procedures (including critical care time)  Labs Reviewed  COMPREHENSIVE METABOLIC PANEL - Abnormal; Notable for the following:    Creatinine, Ser 1.25 (*)     Albumin 3.4 (*)     GFR calc non Af Amer 42 (*)     GFR calc Af Amer 49 (*)     All other components within normal limits  POCT I-STAT, CHEM 8 - Abnormal; Notable for the following:    Creatinine, Ser 1.20 (*)     All other components within normal limits  CBC WITH DIFFERENTIAL  URINALYSIS, ROUTINE W REFLEX MICROSCOPIC  PROTIME-INR  APTT  TYPE AND SCREEN  POCT I-STAT TROPONIN I  ABO/RH  LAB REPORT - SCANNED   No results found.   1. Back pain      Date: 11/13/2011  Rate: 58  Rhythm: sinus bradycardia  QRS Axis: normal  Intervals: normal  ST/T Wave abnormalities: nonspecific T wave changes  Conduction Disutrbances:none  Narrative Interpretation:   Old EKG Reviewed: changes noted    MDM          Loren Racer, MD 11/16/11 [phone]

## 2011-11-13 NOTE — ED Notes (Signed)
Pt daughter states "I really don't think my mother should be discharged today. I am worried about her pain level, and her back history. I am just wondering if there is any way she can stay tonight." MD Adriana Simas made aware.

## 2011-11-13 NOTE — ED Provider Notes (Signed)
History     CSN: [id number]  Arrival date & time 11/13/11  1310   First MD Initiated Contact with Patient 11/13/11 1340      Chief Complaint  Patient presents with  . AAA  . Back Pain  . Hypertension    (Consider location/radiation/quality/duration/timing/severity/associated sxs/prior treatment) HPI  Past Medical History  Diagnosis Date  . Hypertension   . Stroke   . Hypercholesterolemia   . AAA (abdominal aortic aneurysm) without rupture     Past Surgical History  Procedure Date  . Back surgery     History reviewed. No pertinent family history.  History  Substance Use Topics  . Smoking status: Current Every Day Smoker  . Smokeless tobacco: Not on file  . Alcohol Use: No    OB History    Grav Para Term Preterm Abortions TAB SAB Ect Mult Living                  Review of Systems  Allergies  Review of patient's allergies indicates no known allergies.  Home Medications   Current Outpatient Rx  Name Route Sig Dispense Refill  . ALBUTEROL SULFATE HFA 108 (90 BASE) MCG/ACT IN AERS Inhalation Inhale 2 puffs into the lungs every 6 (six) hours as needed. For shortness of breath    . VITAMIN B COMPLEX PO Oral Take 1 tablet by mouth daily.    Marland Kitchen VITAMIN D-3 PO Oral Take 1,200 Units by mouth daily.    Marland Kitchen DIAZEPAM 10 MG PO TABS Oral Take 10 mg by mouth every 8 (eight) hours. For anxiety    . ENALAPRIL MALEATE 20 MG PO TABS Oral Take 20 mg by mouth 2 (two) times daily.    Marland Kitchen METOPROLOL TARTRATE 50 MG PO TABS Oral Take 50 mg by mouth 2 (two) times daily.    . TRAMADOL HCL 50 MG PO TABS Oral Take 50 mg by mouth 2 (two) times daily as needed. For pain    . HYDROCODONE-ACETAMINOPHEN 5-325 MG PO TABS Oral Take 1 tablet by mouth every 4 (four) hours as needed for pain. 10 tablet 0  . HYDROCODONE-ACETAMINOPHEN 5-325 MG PO TABS Oral Take 1-2 tablets by mouth every 4 (four) hours as needed for pain. 25 tablet 0  . METHOCARBAMOL 500 MG PO TABS Oral Take 1 tablet (500 mg total)  by mouth 2 (two) times daily. 20 tablet 0  . METHOCARBAMOL 500 MG PO TABS Oral Take 1 tablet (500 mg total) by mouth 2 (two) times daily. 20 tablet 0    BP 156/72  Pulse 60  Temp 98.1 F (36.7 C) (Oral)  Resp 17  SpO2 95%  Physical Exam  ED Course  Procedures (including critical care time)  Labs Reviewed  COMPREHENSIVE METABOLIC PANEL - Abnormal; Notable for the following:    Creatinine, Ser 1.25 (*)     Albumin 3.4 (*)     GFR calc non Af Amer 42 (*)     GFR calc Af Amer 49 (*)     All other components within normal limits  POCT I-STAT, CHEM 8 - Abnormal; Notable for the following:    Creatinine, Ser 1.20 (*)     All other components within normal limits  CBC WITH DIFFERENTIAL  URINALYSIS, ROUTINE W REFLEX MICROSCOPIC  PROTIME-INR  APTT  TYPE AND SCREEN  POCT I-STAT TROPONIN I  ABO/RH   Dg Lumbar Spine Complete  11/13/2011  *RADIOLOGY REPORT*  Clinical Data: back pain  LUMBAR SPINE - COMPLETE 4+ VIEW  Comparison: 11/13/2011  Findings: Normal alignment of the lumbar spine.  The vertebral body heights are well preserved.  Disc space narrowing is noted at L4-5 and L5-S1 consistent with degenerative disc disease.  No fracture or subluxation noted.  There is a battery pack posterior to the lower lumbar spine with leads entering the spinal canal at the sacral level.  IMPRESSION:  1.  Degenerative disc disease. 2.  No acute findings.   Original Report Authenticated By: Rosealee Albee, M.D.    Ct Angio Ao+bifem W/cm &/or Wo/cm  11/13/2011  *RADIOLOGY REPORT*  Clinical data: Abdominal aortic aneurysm.  Back pain.  CT ANGIOGRAM ABDOMINAL AORTA AND BILATERAL LOWER EXTREMITIES WITH CONTRAST  Technique:  Axial helical CT of the abdominal aorta and bilateral lower extremities after ml Optiray 350 IV.  Coronal and sagittal reconstructions were generated for vascular evaluation.  Comparison:  Ultrasound 03/30/2011 and earlier studies  Arterial findings: Aorta:                  Visualized  distal descending thoracic aorta is tortuous and ectatic with a saccular aneurysm at its left posterolateral aspect just above the diaphragm, measuring up to 4.8 cm diameter (previously 3.9 cm on CT chest of 01/15/2006).  There is   eccentric irregular nonocclusive mural thrombus in the lumen of the aneurysm. There is diffuse aneurysmal dilatation of the abdominal aortic aneurysm, measuring 3.7 cm transverse diameter at the level of the celiac axis, 4.3 cm in its immediate infrarenal segment, with a 2.5 cm diameter waist just proximal to a  4.5 cm aneurysm of the distal infrarenal segment which extends to just above the bifurcation, diameter 18 mm. No dissection or stenosis. There is eccentric nonocclusive mural thrombus in the aneurysmal segments.  No definite hyperdense segment.  No retroperitoneal hemorrhage or periaortic inflammatory/edematous changes.  Celiac axis:            Short segment ostial stenosis of at least 75% diameter narrowing, with some poststenotic dilatation.  The more distal celiac axis is patent.  There is scattered calcified plaque through the splenic artery.  Superior mesenteric:Mild ostial plaque resulting in less than 50% diameter stenosis, widely patent distally with classic distal branching anatomy.  Left renal:             Ostial occlusion, without significant distal reconstitution.  Right renal:            Single, with a short segment ostial stenosis of 50-75% diameter narrowing, widely patent distally.  Inferior mesenteric:Patent, with mild ostial stenosis related to aortic wall plaque.  Left iliac:             Tortuous and ectatic without dissection or stenosis.  Right iliac:            Tortuous, with ectasia of the common iliac artery to 16 mm diameter in its mid segment.  No dissection or stenosis.  Left lower extremity:  High grade origin stenosis of the SFA, with tandem mild stenoses in its midportion.  There is a short segment occlusion at the level of the adductor canal, with  collateral reconstitution of the popliteal artery which is diminutive but patent.  Anterior tibial and posterior tibial arteries occlude at the mid calf level.  There is contiguous peroneal runoff with anterior and posterior communicating branches providing flow across the ankle to the foot.  Right lower extremity:  There is mild scattered plaque through the SFA with   tandem stenoses in its mid and distal  portion.  There is mild atheromatous irregularity of the popliteal artery without high- grade stenosis.  There is proximal occlusion of the anterior tibial and posterior tibial arteries.  There is contiguous peroneal runoff, with posterior and anterior communicating arteries providing flow across the ankle.  Venous findings:  Dedicated venous imaging was not obtained.   Review of the MIP images confirms the above findings.  Nonvascular findings: Minimal linear scarring or subsegmental atelectasis in the visualized lung bases.  Unremarkable arterial phase evaluation of the liver and spleen, adrenal glands, gallbladder, pancreas.  There is marked left renal parenchymal atrophy with markedly decreased enhancement.  There is compensatory hypertrophy of the right kidney without focal lesion or hydronephrosis.  Stomach and small bowel are decompressed.  Normal appendix.  The colon is nondilated.  Urinary bladder incompletely distended.  Bilateral pelvic phleboliths.  No ascites.  No free air.  No adenopathy evident.  Postoperative changes noted in the lower lumbar spine.  IMPRESSION:  1.  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 The distal descending thoracic saccular component has increased from 3.9 to 4.8 cm diameter since 2007. The dominant abdominal aortic component is stable in maximal diameter at 4.5 cm. 2.  Left SFA occlusive disease with reconstituted contiguous peroneal runoff. 3.  Scattered right femoropopliteal occlusive disease with contiguous peroneal runoff. 4.  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 5.  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   Original Report Authenticated By: Thora Lance III, M.D.      1. Back pain       MDM  Back pain appears to be musculoskeletal. No clinical evidence of symptomatic AAA.  Findings were discussed with patient and her daughter. She was discharged home.  Family reports blood pressures always elevated.  Patient observed in the ED for several hours and no deterioration status.  She is tender in the bony aspect of her lumbar spine        Donnetta Hutching, MD 11/17/11 2159

## 2011-11-13 NOTE — ED Notes (Addendum)
Pt requesting to speak with MD about plan of care. Family concerned that back pain is due to aneurysm. Daughter upset that "there is no one following my mother's case, and her primary care physician doesn't seem to think anything is wrong."  MD Adriana Simas made aware of family concerns.

## 2011-12-09 ENCOUNTER — Encounter: Payer: Medicare Other | Admitting: Vascular Surgery

## 2011-12-23 ENCOUNTER — Other Ambulatory Visit (HOSPITAL_COMMUNITY): Payer: Self-pay | Admitting: Internal Medicine

## 2011-12-23 DIAGNOSIS — Z780 Asymptomatic menopausal state: Secondary | ICD-10-CM

## 2011-12-29 ENCOUNTER — Other Ambulatory Visit (HOSPITAL_COMMUNITY): Payer: Self-pay | Admitting: Internal Medicine

## 2011-12-29 ENCOUNTER — Encounter: Payer: Self-pay | Admitting: Vascular Surgery

## 2011-12-29 ENCOUNTER — Ambulatory Visit (HOSPITAL_COMMUNITY)
Admission: RE | Admit: 2011-12-29 | Discharge: 2011-12-29 | Disposition: A | Discharge status: Home / Self Care | Payer: Medicare Other | Source: Ambulatory Visit | Attending: Internal Medicine | Admitting: Internal Medicine

## 2011-12-29 DIAGNOSIS — Z0000 Encounter for general adult medical examination without abnormal findings: Secondary | ICD-10-CM | POA: Insufficient documentation

## 2011-12-29 DIAGNOSIS — R222 Localized swelling, mass and lump, trunk: Secondary | ICD-10-CM | POA: Insufficient documentation

## 2011-12-29 DIAGNOSIS — F17200 Nicotine dependence, unspecified, uncomplicated: Secondary | ICD-10-CM | POA: Insufficient documentation

## 2011-12-29 DIAGNOSIS — Z780 Asymptomatic menopausal state: Secondary | ICD-10-CM | POA: Insufficient documentation

## 2011-12-29 DIAGNOSIS — I10 Essential (primary) hypertension: Secondary | ICD-10-CM | POA: Insufficient documentation

## 2011-12-29 DIAGNOSIS — Z13820 Encounter for screening for osteoporosis: Secondary | ICD-10-CM | POA: Insufficient documentation

## 2011-12-30 ENCOUNTER — Ambulatory Visit (INDEPENDENT_AMBULATORY_CARE_PROVIDER_SITE_OTHER): Payer: Medicare Other | Admitting: Vascular Surgery

## 2011-12-30 ENCOUNTER — Encounter: Payer: Self-pay | Admitting: Vascular Surgery

## 2011-12-30 ENCOUNTER — Other Ambulatory Visit: Payer: Self-pay | Admitting: Pediatrics

## 2011-12-30 VITALS — BP 135/66 | HR 61 | Ht 67.0 in | Wt 133.0 lb

## 2011-12-30 DIAGNOSIS — I712 Thoracic aortic aneurysm, without rupture, unspecified: Secondary | ICD-10-CM

## 2011-12-30 DIAGNOSIS — I716 Thoracoabdominal aortic aneurysm, without rupture, unspecified: Secondary | ICD-10-CM

## 2011-12-30 DIAGNOSIS — I701 Atherosclerosis of renal artery: Secondary | ICD-10-CM

## 2011-12-30 NOTE — Assessment & Plan Note (Signed)
This patient has a thoracoabdominal aneurysm. The thoracic component is 4.8 cm in maximum diameter. The infrarenal component is 4.5 cm in maximum diameter. 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 In addition she has a left renal artery occlusion with possibly some stenosis in the proximal right renal artery. CT scan does not show any evidence of leak or other explanation for her low back pain which has been chronic for the last 2-3 months. She is somewhat debilitated and has lost 60 pounds in the last several months which she attributes to a poor appetite. I've recommended that we refer her to Dr. Bennie Pierini in [facility] for evaluation and continued follow up of her thoracoabdominal aneurysm. We have also discussed the importance of tobacco cessation and she does continue to smoke. In addition we have discussed the risk of thoracoabdominal aneurysms, specifically the risk of rupture. She understands the continued tobacco use does increase her risk of aneurysm expansion and rupture.

## 2011-12-30 NOTE — Progress Notes (Signed)
[facility] [place]  Patient name: Amy Moss MRN: [id number] DOB: 03-24-1940 Sex: female  REASON FOR CONSULT: thoracoabdominal aneurysm  HPI: Amy Moss is a 71 y.o. female who was seen in the emergency department on 11/13/2011 with low back pain. Workup for that included a CT scan of the abdomen and pelvis which showed a thoracoabdominal aneurysm. There was no evidence of leak of her aneurysm and she was set up for an outpatient visit. She states that she has had low back pain for 2-3 months. Pain is aggravated by activity. She did have lumbar spine films during her ER visit which shows some degenerative disc disease. She denies any sudden onset of abdominal pain or back pain. She's had no injury to her back. Her other complaint has been some poor appetite for several months a 60 pound weight loss. The abdominal component of her aneurysm has been followed with her intermittent ultrasounds by Dr. Lucky Cowboy. The aneurysm has not been large enough to consider elective repair.   Her father had an intracranial aneurysm.  Past Medical History  Diagnosis Date  . Hypertension   . Stroke   . Hypercholesterolemia   . AAA (abdominal aortic aneurysm) without rupture   . Anemia     Family History  Problem Relation Age of Onset  . Heart disease Sister   . Heart disease Brother   . Cancer Sister     SOCIAL HISTORY: History  Substance Use Topics  . Smoking status: Current Every Day Smoker -- 1.0 packs/day    Types: Cigarettes  . Smokeless tobacco: Never Used  . Alcohol Use: No    No Known Allergies  Current Outpatient Prescriptions  Medication Sig Dispense Refill  . B Complex Vitamins (VITAMIN B COMPLEX PO) Take 1 tablet by mouth daily.      . Cetirizine HCl (ZYRTEC PO) Take by mouth as needed.      . Cholecalciferol (VITAMIN D-3 PO) Take 1,200 Units by mouth daily.      . diazepam (VALIUM) 10 MG tablet Take 10 mg by mouth every 8 (eight) hours. For  anxiety      . enalapril (VASOTEC) 20 MG tablet Take 20 mg by mouth 2 (two) times daily.      . metoprolol (LOPRESSOR) 50 MG tablet Take 50 mg by mouth 2 (two) times daily.      . traMADol (ULTRAM) 50 MG tablet Take 50 mg by mouth 2 (two) times daily as needed. For pain      . albuterol (PROVENTIL HFA;VENTOLIN HFA) 108 (90 BASE) MCG/ACT inhaler Inhale 2 puffs into the lungs every 6 (six) hours as needed. For shortness of breath      . HYDROcodone-acetaminophen (NORCO/VICODIN) 5-325 MG per tablet Take 1 tablet by mouth every 4 (four) hours as needed for pain.  10 tablet  0  . HYDROcodone-acetaminophen (NORCO/VICODIN) 5-325 MG per tablet Take 1-2 tablets by mouth every 4 (four) hours as needed for pain.  25 tablet  0  . methocarbamol (ROBAXIN) 500 MG tablet Take 1 tablet (500 mg total) by mouth 2 (two) times daily.  20 tablet  0  . methocarbamol (ROBAXIN) 500 MG tablet Take 1 tablet (500 mg total) by mouth 2 (two) times daily.  20 tablet  0    REVIEW OF SYSTEMS: Arly.[email or web address] ] denotes positive finding; [  ] denotes negative finding  CARDIOVASCULAR:  [ ]  chest pain   [ ]  chest pressure   [ ]  palpitations   [ ]   orthopnea   Arly.Keller ] dyspnea on exertion   [ ]  claudication   [ ]  rest pain   [ ]  DVT   [ ]  phlebitis PULMONARY:   [ ]  productive cough   [ ]  asthma   [ ]  wheezing NEUROLOGIC:   Arly.Keller ] weakness  [ ]  paresthesias  [ ]  aphasia  [ ]  amaurosis  [ ]  dizziness HEMATOLOGIC:   [ ]  bleeding problems   [ ]  clotting disorders MUSCULOSKELETAL:  Arly.Keller ] joint pain   [ ]  joint swelling [ ]  leg swelling GASTROINTESTINAL: [ ]   blood in stool  [ ]   hematemesis GENITOURINARY:  [ ]   dysuria  [ ]   hematuria PSYCHIATRIC:  [ ]  history of major depression INTEGUMENTARY:  [ ]  rashes  [ ]  ulcers CONSTITUTIONAL:  [ ]  fever   [ ]  chills  PHYSICAL EXAM: Filed Vitals:   12/30/11 1117  BP: 135/66  Pulse: 61  Height: 5\' 7"  (1.702 m)  Weight: 133 lb (60.328 kg)  SpO2: 100%   Body mass index is 20.83 kg/(m^2). GENERAL: The patient  is a well-nourished female, in no acute distress. The vital signs are documented above. CARDIOVASCULAR: There is a regular rate and rhythm. I do not detect carotid bruits. She has palpable femoral pulses. I cannot palpate pedal pulses. She has no significant lower extremity swelling to PULMONARY: There is good air exchange bilaterally without wheezing or rales. ABDOMEN: Soft and non-tender with normal pitched bowel sounds. Her abdominal aneurysm is easily palpable and nontender. MUSCULOSKELETAL: There are no major deformities or cyanosis. NEUROLOGIC: No focal weakness or paresthesias are detected. SKIN: There are no ulcers or rashes noted. PSYCHIATRIC: The patient has a normal affect.  DATA:  I have reviewed her CT scan. This shows that the thoracic component of her aneurysm measures 4.8 cm in maximum diameter. The infrarenal component measures 4.5 cm in maximum diameter. There is also aneurysmal disease at the level of the celiac axis, SMA, and renal arteries. The right renal artery is patent. The left renal artery is occluded. There is possibly some stenosis at the origin of the right renal artery.  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 The thoracic component is 4.8 cm in maximum diameter. The infrarenal component is 4.5 cm in maximum diameter. 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 In addition she has a left renal artery occlusion with possibly some stenosis in the proximal right renal artery. CT scan does not show any evidence of leak or other explanation for her low back pain which has been chronic for the last 2-3 months. She is somewhat debilitated and has lost 60 pounds in the last several months which she attributes to a poor appetite. I've recommended that we refer her to Dr. Bennie Pierini in [facility] for evaluation and continued follow up of her  thoracoabdominal aneurysm. We have also discussed the importance of tobacco cessation and she does continue to smoke. In addition we have discussed the risk of thoracoabdominal aneurysms, specifically the risk of rupture. She understands the continued tobacco use does increase her risk of aneurysm expansion and rupture.   Kaicee Scarpino S [facility] [place] Beeper: [phone]

## 2012-01-06 ENCOUNTER — Other Ambulatory Visit (HOSPITAL_COMMUNITY): Payer: Self-pay | Admitting: Internal Medicine

## 2012-01-06 DIAGNOSIS — M549 Dorsalgia, unspecified: Secondary | ICD-10-CM

## 2012-01-06 DIAGNOSIS — I669 Occlusion and stenosis of unspecified cerebral artery: Secondary | ICD-10-CM

## 2012-01-07 ENCOUNTER — Encounter: Payer: Self-pay | Admitting: Internal Medicine

## 2012-01-11 ENCOUNTER — Other Ambulatory Visit (HOSPITAL_COMMUNITY): Payer: Self-pay | Admitting: Internal Medicine

## 2012-01-11 ENCOUNTER — Ambulatory Visit (HOSPITAL_COMMUNITY)
Admission: RE | Admit: 2012-01-11 | Discharge: 2012-01-11 | Disposition: A | Discharge status: Home / Self Care | Payer: Medicare Other | Source: Ambulatory Visit | Attending: Internal Medicine | Admitting: Internal Medicine

## 2012-01-11 DIAGNOSIS — M549 Dorsalgia, unspecified: Secondary | ICD-10-CM

## 2012-01-11 DIAGNOSIS — M5137 Other intervertebral disc degeneration, lumbosacral region: Secondary | ICD-10-CM | POA: Insufficient documentation

## 2012-01-11 DIAGNOSIS — M51379 Other intervertebral disc degeneration, lumbosacral region without mention of lumbar back pain or lower extremity pain: Secondary | ICD-10-CM | POA: Insufficient documentation

## 2012-01-11 DIAGNOSIS — I669 Occlusion and stenosis of unspecified cerebral artery: Secondary | ICD-10-CM | POA: Insufficient documentation

## 2012-01-11 DIAGNOSIS — R55 Syncope and collapse: Secondary | ICD-10-CM | POA: Insufficient documentation

## 2012-01-11 MED ORDER — IOHEXOL 300 MG/ML  SOLN
75.0000 mL | Freq: Once | INTRAMUSCULAR | Status: AC | PRN
  Administered 2012-01-11: 75 mL via INTRAVENOUS

## 2012-01-15 ENCOUNTER — Encounter: Payer: Self-pay | Admitting: *Deleted

## 2012-02-08 ENCOUNTER — Ambulatory Visit: Payer: Medicare Other | Admitting: Gastroenterology

## 2012-02-17 ENCOUNTER — Ambulatory Visit: Payer: Medicare Other | Admitting: Internal Medicine

## 2012-05-24 ENCOUNTER — Other Ambulatory Visit: Payer: Self-pay | Admitting: Internal Medicine

## 2012-05-24 DIAGNOSIS — I719 Aortic aneurysm of unspecified site, without rupture: Secondary | ICD-10-CM

## 2012-05-26 ENCOUNTER — Other Ambulatory Visit: Payer: Medicare Other

## 2012-06-03 ENCOUNTER — Ambulatory Visit
Admission: RE | Admit: 2012-06-03 | Discharge: 2012-06-03 | Disposition: A | Discharge status: Home / Self Care | Payer: Medicare Other | Source: Ambulatory Visit | Attending: Internal Medicine | Admitting: Internal Medicine

## 2012-06-03 DIAGNOSIS — I719 Aortic aneurysm of unspecified site, without rupture: Secondary | ICD-10-CM

## 2013-01-04 ENCOUNTER — Encounter: Payer: Self-pay | Admitting: Internal Medicine

## 2013-01-05 ENCOUNTER — Encounter: Payer: Self-pay | Admitting: Internal Medicine

## 2013-01-05 ENCOUNTER — Ambulatory Visit (INDEPENDENT_AMBULATORY_CARE_PROVIDER_SITE_OTHER): Payer: Medicare Other | Admitting: Internal Medicine

## 2013-01-05 VITALS — BP 128/72 | HR 72 | Temp 97.9°F | Resp 16 | Ht 66.75 in | Wt 133.6 lb

## 2013-01-05 DIAGNOSIS — N6019 Diffuse cystic mastopathy of unspecified breast: Secondary | ICD-10-CM

## 2013-01-05 DIAGNOSIS — F32A Depression, unspecified: Secondary | ICD-10-CM | POA: Insufficient documentation

## 2013-01-05 DIAGNOSIS — Z79899 Other long term (current) drug therapy: Secondary | ICD-10-CM

## 2013-01-05 DIAGNOSIS — E559 Vitamin D deficiency, unspecified: Secondary | ICD-10-CM | POA: Insufficient documentation

## 2013-01-05 DIAGNOSIS — Z0000 Encounter for general adult medical examination without abnormal findings: Secondary | ICD-10-CM

## 2013-01-05 DIAGNOSIS — F329 Major depressive disorder, single episode, unspecified: Secondary | ICD-10-CM | POA: Insufficient documentation

## 2013-01-05 DIAGNOSIS — I639 Cerebral infarction, unspecified: Secondary | ICD-10-CM | POA: Insufficient documentation

## 2013-01-05 DIAGNOSIS — E782 Mixed hyperlipidemia: Secondary | ICD-10-CM | POA: Insufficient documentation

## 2013-01-05 DIAGNOSIS — I714 Abdominal aortic aneurysm, without rupture, unspecified: Secondary | ICD-10-CM | POA: Insufficient documentation

## 2013-01-05 DIAGNOSIS — R7303 Prediabetes: Secondary | ICD-10-CM | POA: Insufficient documentation

## 2013-01-05 DIAGNOSIS — J449 Chronic obstructive pulmonary disease, unspecified: Secondary | ICD-10-CM

## 2013-01-05 DIAGNOSIS — R7309 Other abnormal glucose: Secondary | ICD-10-CM

## 2013-01-05 DIAGNOSIS — I10 Essential (primary) hypertension: Secondary | ICD-10-CM | POA: Insufficient documentation

## 2013-01-05 DIAGNOSIS — Z1212 Encounter for screening for malignant neoplasm of rectum: Secondary | ICD-10-CM

## 2013-01-05 LAB — CBC WITH DIFFERENTIAL/PLATELET
Hemoglobin: 13.9 g/dL (ref 12.0–15.0)
Lymphocytes Relative: 33 % (ref 12–46)
Lymphs Abs: 2.4 10*3/uL (ref 0.7–4.0)
MCV: 90.1 fL (ref 78.0–100.0)
Monocytes Relative: 11 % (ref 3–12)
Neutrophils Relative %: 52 % (ref 43–77)
Platelets: 191 10*3/uL (ref 150–400)
RBC: 4.46 MIL/uL (ref 3.87–5.11)
WBC: 7 10*3/uL (ref 4.0–10.5)

## 2013-01-05 LAB — HEPATIC FUNCTION PANEL
AST: 19 U/L (ref 0–37)
Alkaline Phosphatase: 67 U/L (ref 39–117)
Bilirubin, Direct: 0.1 mg/dL (ref 0.0–0.3)
Indirect Bilirubin: 0.3 mg/dL (ref 0.0–0.9)
Total Bilirubin: 0.4 mg/dL (ref 0.3–1.2)

## 2013-01-05 LAB — LIPID PANEL
HDL: 42 mg/dL (ref >39)
LDL Cholesterol: 115 mg/dL — ABNORMAL HIGH (ref 0–99)
Total CHOL/HDL Ratio: 4.5 Ratio
VLDL: 32 mg/dL (ref 0–40)

## 2013-01-05 LAB — BASIC METABOLIC PANEL WITH GFR
CO2: 30 mEq/L (ref 19–32)
Chloride: 103 mEq/L (ref 96–112)
Sodium: 138 mEq/L (ref 135–145)

## 2013-01-05 MED ORDER — MINOXIDIL 2.5 MG PO TABS
2.5000 mg | ORAL_TABLET | Freq: Two times a day (BID) | ORAL | Status: DC
Start: 2013-01-05 — End: 2013-01-16

## 2013-01-05 MED ORDER — CITALOPRAM HYDROBROMIDE 20 MG PO TABS: 20.0000 mg | ORAL_TABLET | Freq: Every day | ORAL | Status: DC

## 2013-01-05 MED ORDER — METOPROLOL TARTRATE 50 MG PO TABS: ORAL_TABLET | ORAL | Status: DC

## 2013-01-05 MED ORDER — DIAZEPAM 10 MG PO TABS: ORAL_TABLET | ORAL | Status: DC

## 2013-01-05 NOTE — Patient Instructions (Addendum)
Continue diet & medications same as discussed.   Further disposition pending lab results.    Smoking Cessation, Tips for Success YOU CAN QUIT SMOKING If you are ready to quit smoking, congratulations! You have chosen to help yourself be healthier. Cigarettes bring nicotine, tar, carbon monoxide, and other irritants into your body. Your lungs, heart, and blood vessels will be able to work better without these poisons. There are many different ways to quit smoking. Nicotine gum, nicotine patches, a nicotine inhaler, or nicotine nasal spray can help with physical craving. Hypnosis, support groups, and medicines help break the habit of smoking. Here are some tips to help you quit for good.  Throw away all cigarettes.  Clean and remove all ashtrays from your home, work, and car.  On a card, write down your reasons for quitting. Carry the card with you and read it when you get the urge to smoke.  Cleanse your body of nicotine. Drink enough water and fluids to keep your urine clear or pale yellow. Do this after quitting to flush the nicotine from your body.  Learn to predict your moods. Do not let a bad situation be your excuse to have a cigarette. Some situations in your life might tempt you into wanting a cigarette.  Never have "just one" cigarette. It leads to wanting another and another. Remind yourself of your decision to quit.  Change habits associated with smoking. If you smoked while driving or when feeling stressed, try other activities to replace smoking. Stand up when drinking your coffee. Brush your teeth after eating. Sit in a different chair when you read the paper. Avoid alcohol while trying to quit, and try to drink fewer caffeinated beverages. Alcohol and caffeine may urge you to smoke.  Avoid foods and drinks that can trigger a desire to smoke, such as sugary or spicy foods and alcohol.  Ask people who smoke not to smoke around you.  Have something planned to do right after  eating or having a cup of coffee. Take a walk or exercise to perk you up. This will help to keep you from overeating.  Try a relaxation exercise to calm you down and decrease your stress. Remember, you may be tense and nervous for the first 2 weeks after you quit, but this will pass.  Find new activities to keep your hands busy. Play with a pen, coin, or rubber band. Doodle or draw things on paper.  Brush your teeth right after eating. This will help cut down on the craving for the taste of tobacco after meals. You can try mouthwash, too.  Use oral substitutes, such as lemon drops, carrots, a cinnamon stick, or chewing gum, in place of cigarettes. Keep them handy so they are available when you have the urge to smoke.  When you have the urge to smoke, try deep breathing.  Designate your home as a nonsmoking area.  If you are a heavy smoker, ask your caregiver about a prescription for nicotine chewing gum. It can ease your withdrawal from nicotine.  Reward yourself. Set aside the cigarette money you save and buy yourself something nice.  Look for support from others. Join a support group or smoking cessation program. Ask someone at home or at work to help you with your plan to quit smoking.  Always ask yourself, "Do I need this cigarette or is this just a reflex?" Tell yourself, "Today, I choose not to smoke," or "I do not want to smoke." You are reminding yourself  of your decision to quit, even if you do smoke a cigarette. HOW WILL I FEEL WHEN I QUIT SMOKING?  The benefits of not smoking start within days of quitting.  You may have symptoms of withdrawal because your body is used to nicotine (the addictive substance in cigarettes). You may crave cigarettes, be irritable, feel very hungry, cough often, get headaches, or have difficulty concentrating.  The withdrawal symptoms are only temporary. They are strongest when you first quit but will go away within 10 to 14 days.  When withdrawal  symptoms occur, stay in control. Think about your reasons for quitting. Remind yourself that these are signs that your body is healing and getting used to being without cigarettes.  Remember that withdrawal symptoms are easier to treat than the major diseases that smoking can cause.  Even after the withdrawal is over, expect periodic urges to smoke. However, these cravings are generally short-lived and will go away whether you smoke or not. Do not smoke!  If you relapse and smoke again, do not lose hope. Most smokers quit 3 times before they are successful.  If you relapse, do not give up! Plan ahead and think about what you will do the next time you get the urge to smoke. LIFE AS A NONSMOKER: MAKE IT FOR A MONTH, MAKE IT FOR LIFE Day 1: Hang this page where you will see it every day. Day 2: Get rid of all ashtrays, matches, and lighters. Day 3: Drink water. Breathe deeply between sips. Day 4: Avoid places with smoke-filled air, such as bars, clubs, or the smoking section of restaurants. Day 5: Keep track of how much money you save by not smoking. Day 6: Avoid boredom. Keep a good book with you or go to the movies. Day 7: Reward yourself! One week without smoking! Day 8: Make a dental appointment to get your teeth cleaned. Day 9: Decide how you will turn down a cigarette before it is offered to you. Day 10: Review your reasons for quitting. Day 11: Distract yourself. Stay active to keep your mind off smoking and to relieve tension. Take a walk, exercise, read a book, do a crossword puzzle, or try a new hobby. Day 12: Exercise. Get off the bus before your stop or use stairs instead of escalators. Day 13: Call on friends for support and encouragement. Day 14: Reward yourself! Two weeks without smoking! Day 15: Practice deep breathing exercises. Day 16: Bet a friend that you can stay a nonsmoker. Day 17: Ask to sit in nonsmoking sections of restaurants. Day 18: Hang up "No Smoking"  signs. Day 19: Think of yourself as a nonsmoker. Day 20: Each morning, tell yourself you will not smoke. Day 21: Reward yourself! Three weeks without smoking! Day 22: Think of smoking in negative ways. Remember how it stains your teeth, gives you bad breath, and leaves you short of breath. Day 23: Eat a nutritious breakfast. Day 24:Do not relive your days as a smoker. Day 25: Hold a pencil in your hand when talking on the telephone. Day 26: Tell all your friends you do not smoke. Day 27: Think about how much better food tastes. Day 28: Remember, one cigarette is one too many. Day 29: Take up a hobby that will keep your hands busy. Day 30: Congratulations! One month without smoking! Give yourself a big reward. Your caregiver can direct you to community resources or hospitals for support, which may include:  Group support.  Education.  Hypnosis.  Subliminal  therapy. Document Released: 10/04/2003 Document Revised: 03/30/2011 Document Reviewed: 06/23/2012 [facility] Patient Information 2014 [place], [place].    Smoking Cessation Quitting smoking is important to your health and has many advantages. However, it is not always easy to quit since nicotine is a very addictive drug. Often times, people try 3 times or more before being able to quit. This document explains the best ways for you to prepare to quit smoking. Quitting takes hard work and a lot of effort, but you can do it. ADVANTAGES OF QUITTING SMOKING  You will live longer, feel better, and live better.  Your body will feel the impact of quitting smoking almost immediately.  Within 20 minutes, blood pressure decreases. Your pulse returns to its normal level.  After 8 hours, carbon monoxide levels in the blood return to normal. Your oxygen level increases.  After 24 hours, the chance of having a heart attack starts to decrease. Your breath, hair, and body stop smelling like smoke.  After 48 hours, damaged nerve endings begin to  recover. Your sense of taste and smell improve.  After 72 hours, the body is virtually free of nicotine. Your bronchial tubes relax and breathing becomes easier.  After 2 to 12 weeks, lungs can hold more air. Exercise becomes easier and circulation improves.  The risk of having a heart attack, stroke, cancer, or lung disease is greatly reduced.  After 1 year, the risk of coronary heart disease is cut in half.  After 5 years, the risk of stroke falls to the same as a nonsmoker.  After 10 years, the risk of lung cancer is cut in half and the risk of other cancers decreases significantly.  After 15 years, the risk of coronary heart disease drops, usually to the level of a nonsmoker.  If you are pregnant, quitting smoking will improve your chances of having a healthy baby.  The people you live with, especially any children, will be healthier.  You will have extra money to spend on things other than cigarettes. QUESTIONS TO THINK ABOUT BEFORE ATTEMPTING TO QUIT You may want to talk about your answers with your caregiver.  Why do you want to quit?  If you tried to quit in the past, what helped and what did not?  What will be the most difficult situations for you after you quit? How will you plan to handle them?  Who can help you through the tough times? Your family? Friends? A caregiver?  What pleasures do you get from smoking? What ways can you still get pleasure if you quit? Here are some questions to ask your caregiver:  How can you help me to be successful at quitting?  What medicine do you think would be best for me and how should I take it?  What should I do if I need more help?  What is smoking withdrawal like? How can I get information on withdrawal? GET READY  Set a quit date.  Change your environment by getting rid of all cigarettes, ashtrays, matches, and lighters in your home, car, or work. Do not let people smoke in your home.  Review your past attempts to quit.  Think about what worked and what did not. GET SUPPORT AND ENCOURAGEMENT You have a better chance of being successful if you have help. You can get support in many ways.  Tell your family, friends, and co-workers that you are going to quit and need their support. Ask them not to smoke around you.  Get individual, group,  or telephone counseling and support. Programs are available at [employer] and health centers. Call your local health department for information about programs in your area.  Spiritual beliefs and practices may help some smokers quit.  Download a "quit meter" on your computer to keep track of quit statistics, such as how long you have gone without smoking, cigarettes not smoked, and money saved.  Get a self-help book about quitting smoking and staying off of tobacco. LEARN NEW SKILLS AND BEHAVIORS  Distract yourself from urges to smoke. Talk to someone, go for a walk, or occupy your time with a task.  Change your normal routine. Take a different route to work. Drink tea instead of coffee. Eat breakfast in a different place.  Reduce your stress. Take a hot bath, exercise, or read a book.  Plan something enjoyable to do every day. Reward yourself for not smoking.  Explore interactive web-based programs that specialize in helping you quit. GET MEDICINE AND USE IT CORRECTLY Medicines can help you stop smoking and decrease the urge to smoke. Combining medicine with the above behavioral methods and support can greatly increase your chances of successfully quitting smoking.  Nicotine replacement therapy helps deliver nicotine to your body without the negative effects and risks of smoking. Nicotine replacement therapy includes nicotine gum, lozenges, inhalers, nasal sprays, and skin patches. Some may be available over-the-counter and others require a prescription.  Antidepressant medicine helps people abstain from smoking, but how this works is unknown. This medicine is  available by prescription.  Nicotinic receptor partial agonist medicine simulates the effect of nicotine in your brain. This medicine is available by prescription. Ask your caregiver for advice about which medicines to use and how to use them based on your health history. Your caregiver will tell you what side effects to look out for if you choose to be on a medicine or therapy. Carefully read the information on the package. Do not use any other product containing nicotine while using a nicotine replacement product.  RELAPSE OR DIFFICULT SITUATIONS Most relapses occur within the first 3 months after quitting. Do not be discouraged if you start smoking again. Remember, most people try several times before finally quitting. You may have symptoms of withdrawal because your body is used to nicotine. You may crave cigarettes, be irritable, feel very hungry, cough often, get headaches, or have difficulty concentrating. The withdrawal symptoms are only temporary. They are strongest when you first quit, but they will go away within 10 14 days. To reduce the chances of relapse, try to:  Avoid drinking alcohol. Drinking lowers your chances of successfully quitting.  Reduce the amount of caffeine you consume. Once you quit smoking, the amount of caffeine in your body increases and can give you symptoms, such as a rapid heartbeat, sweating, and anxiety.  Avoid smokers because they can make you want to smoke.  Do not let weight gain distract you. Many smokers will gain weight when they quit, usually less than 10 pounds. Eat a healthy diet and stay active. You can always lose the weight gained after you quit.  Find ways to improve your mood other than smoking. FOR MORE INFORMATION  www.smokefree.gov  Document Released: 12/30/2000 Document Revised: 07/07/2011 Document Reviewed: 04/16/2011 [facility] Patient Information 2014 [place], [place].  Hypertension As your heart beats, it forces blood through your  arteries. This force is your blood pressure. If the pressure is too high, it is called hypertension (HTN) or high blood pressure. HTN is dangerous because you  may have it and not know it. High blood pressure may mean that your heart has to work harder to pump blood. Your arteries may be narrow or stiff. The extra work puts you at risk for heart disease, stroke, and other problems.  Blood pressure consists of two numbers, a higher number over a lower, 110/72, for example. It is stated as "110 over 72." The ideal is below 120 for the top number (systolic) and under 80 for the bottom (diastolic). Write down your blood pressure today. You should pay close attention to your blood pressure if you have certain conditions such as:  Heart failure.  Prior heart attack.  Diabetes  Chronic kidney disease.  Prior stroke.  Multiple risk factors for heart disease. To see if you have HTN, your blood pressure should be measured while you are seated with your arm held at the level of the heart. It should be measured at least twice. A one-time elevated blood pressure reading (especially in the Emergency Department) does not mean that you need treatment. There may be conditions in which the blood pressure is different between your right and left arms. It is important to see your caregiver soon for a recheck. Most people have essential hypertension which means that there is not a specific cause. This type of high blood pressure may be lowered by changing lifestyle factors such as:  Stress.  Smoking.  Lack of exercise.  Excessive weight.  Drug/tobacco/alcohol use.  Eating less salt. Most people do not have symptoms from high blood pressure until it has caused damage to the body. Effective treatment can often prevent, delay or reduce that damage. TREATMENT  When a cause has been identified, treatment for high blood pressure is directed at the cause. There are a large number of medications to treat HTN. These  fall into several categories, and your caregiver will help you select the medicines that are best for you. Medications may have side effects. You should review side effects with your caregiver. If your blood pressure stays high after you have made lifestyle changes or started on medicines,   Your medication(s) may need to be changed.  Other problems may need to be addressed.  Be certain you understand your prescriptions, and know how and when to take your medicine.  Be sure to follow up with your caregiver within the time frame advised (usually within two weeks) to have your blood pressure rechecked and to review your medications.  If you are taking more than one medicine to lower your blood pressure, make sure you know how and at what times they should be taken. Taking two medicines at the same time can result in blood pressure that is too low. SEEK IMMEDIATE MEDICAL CARE IF:  You develop a severe headache, blurred or changing vision, or confusion.  You have unusual weakness or numbness, or a faint feeling.  You have severe chest or abdominal pain, vomiting, or breathing problems. MAKE SURE YOU:   Understand these instructions.  Will watch your condition.  Will get help right away if you are not doing well or get worse. Document Released: 01/05/2005 Document Revised: 03/30/2011 Document Reviewed: 08/26/2007 [facility] Patient Information 2014 [place], [place].  Diabetes and Exercise Exercising regularly is important. It is not just about losing weight. It has many health benefits, such as:  Improving your overall fitness, flexibility, and endurance.  Increasing your bone density.  Helping with weight control.  Decreasing your body fat.  Increasing your muscle strength.  Reducing stress  and tension.  Improving your overall health. People with diabetes who exercise gain additional benefits because exercise:  Reduces appetite.  Improves the body's use of blood sugar  (glucose).  Helps lower or control blood glucose.  Decreases blood pressure.  Helps control blood lipids (such as cholesterol and triglycerides).  Improves the body's use of the hormone insulin by:  Increasing the body's insulin sensitivity.  Reducing the body's insulin needs.  Decreases the risk for heart disease because exercising:  Lowers cholesterol and triglycerides levels.  Increases the levels of good cholesterol (such as high-density lipoproteins [HDL]) in the body.  Lowers blood glucose levels. YOUR ACTIVITY PLAN  Choose an activity that you enjoy and set realistic goals. Your health care provider or diabetes educator can help you make an activity plan that works for you. You can break activities into 2 or 3 sessions throughout the day. Doing so is as good as one long session. Exercise ideas include:  Taking the dog for a walk.  Taking the stairs instead of the elevator.  Dancing to your favorite song.  Doing your favorite exercise with a friend. RECOMMENDATIONS FOR EXERCISING WITH TYPE 1 OR TYPE 2 DIABETES   Check your blood glucose before exercising. If blood glucose levels are greater than 240 mg/dL, check for urine ketones. Do not exercise if ketones are present.  Avoid injecting insulin into areas of the body that are going to be exercised. For example, avoid injecting insulin into:  The arms when playing tennis.  The legs when jogging.  Keep a record of:  Food intake before and after you exercise.  Expected peak times of insulin action.  Blood glucose levels before and after you exercise.  The type and amount of exercise you have done.  Review your records with your health care provider. Your health care provider will help you to develop guidelines for adjusting food intake and insulin amounts before and after exercising.  If you take insulin or oral hypoglycemic agents, watch for signs and symptoms of hypoglycemia. They  include:  Dizziness.  Shaking.  Sweating.  Chills.  Confusion.  Drink plenty of water while you exercise to prevent dehydration or heat stroke. Body water is lost during exercise and must be replaced.  Talk to your health care provider before starting an exercise program to make sure it is safe for you. Remember, almost any type of activity is better than none. Document Released: 03/28/2003 Document Revised: 09/07/2012 Document Reviewed: 06/14/2012 [facility] Patient Information 2014 [place], [place].  Cholesterol Cholesterol is a white, waxy, fat-like protein needed by your body in small amounts. The liver makes all the cholesterol you need. It is carried from the liver by the blood through the blood vessels. Deposits (plaque) may build up on blood vessel walls. This makes the arteries narrower and stiffer. Plaque increases the risk for heart attack and stroke. You cannot feel your cholesterol level even if it is very high. The only way to know is by a blood test to check your lipid (fats) levels. Once you know your cholesterol levels, you should keep a record of the test results. Work with your caregiver to to keep your levels in the desired range. WHAT THE RESULTS MEAN:  Total cholesterol is a rough measure of all the cholesterol in your blood.  LDL is the so-called bad cholesterol. This is the type that deposits cholesterol in the walls of the arteries. You want this level to be low.  HDL is the good cholesterol because  it cleans the arteries and carries the LDL away. You want this level to be high.  Triglycerides are fat that the body can either burn for energy or store. High levels are closely linked to heart disease. DESIRED LEVELS:  Total cholesterol below 200.  LDL below 100 for people at risk, below 70 for very high risk.  HDL above 50 is good, above 60 is best.  Triglycerides below 150. HOW TO LOWER YOUR CHOLESTEROL:  Diet.  Choose fish or white meat chicken and  [place], roasted or baked. Limit fatty cuts of red meat, fried foods, and processed meats, such as sausage and lunch meat.  Eat lots of fresh fruits and vegetables. Choose whole grains, beans, pasta, potatoes and cereals.  Use only small amounts of olive, corn or canola oils. Avoid butter, mayonnaise, shortening or palm kernel oils. Avoid foods with trans-fats.  Use skim/nonfat milk and low-fat/nonfat yogurt and cheeses. Avoid whole milk, cream, ice cream, egg yolks and cheeses. Healthy desserts include angel food cake, ginger snaps, animal crackers, hard candy, popsicles, and low-fat/nonfat frozen yogurt. Avoid pastries, cakes, pies and cookies.  Exercise.  A regular program helps decrease LDL and raises HDL.  Helps with weight control.  Do things that increase your activity level like gardening, walking, or taking the stairs.  Medication.  May be prescribed by your caregiver to help lowering cholesterol and the risk for heart disease.  You may need medicine even if your levels are normal if you have several risk factors. HOME CARE INSTRUCTIONS   Follow your diet and exercise programs as suggested by your caregiver.  Take medications as directed.  Have blood work done when your caregiver feels it is necessary. MAKE SURE YOU:   Understand these instructions.  Will watch your condition.  Will get help right away if you are not doing well or get worse. Document Released: 09/30/2000 Document Revised: 03/30/2011 Document Reviewed: 03/23/2007 [facility] Patient Information 2014 [place], [place]. Vitamin D Deficiency Vitamin D is an important vitamin that your body needs. Having too little of it in your body is called a deficiency. A very bad deficiency can make your bones soft and can cause a condition called rickets.  Vitamin D is important to your body for different reasons, such as:   It helps your body absorb 2 minerals called calcium and phosphorus.  It helps make your bones  healthy.  It may prevent some diseases, such as diabetes and multiple sclerosis.  It helps your muscles and heart. You can get vitamin D in several ways. It is a natural part of some foods. The vitamin is also added to some dairy products and cereals. Some people take vitamin D supplements. Also, your body makes vitamin D when you are in the sun. It changes the sun's rays into a form of the vitamin that your body can use. CAUSES   Not eating enough foods that contain vitamin D.  Not getting enough sunlight.  Having certain digestive system diseases that make it hard to absorb vitamin D. These diseases include Crohn's disease, chronic pancreatitis, and cystic fibrosis.  Having a surgery in which part of the stomach or small intestine is removed.  Being obese. Fat cells pull vitamin D out of your blood. That means that obese people may not have enough vitamin D left in their blood and in other body tissues.  Having chronic kidney or liver disease. RISK FACTORS Risk factors are things that make you more likely to develop a vitamin D  deficiency. They include:  Being older.  Not being able to get outside very much.  Living in a nursing home.  Having had broken bones.  Having weak or thin bones (osteoporosis).  Having a disease or condition that changes how your body absorbs vitamin D.  Having dark skin.  Some medicines such as seizure medicines or steroids.  Being overweight or obese. SYMPTOMS Mild cases of vitamin D deficiency may not have any symptoms. If you have a very bad case, symptoms may include:  Bone pain.  Muscle pain.  Falling often.  Broken bones caused by a minor injury, due to osteoporosis. DIAGNOSIS A blood test is the best way to tell if you have a vitamin D deficiency. TREATMENT Vitamin D deficiency can be treated in different ways. Treatment for vitamin D deficiency depends on what is causing it. Options include:  Taking vitamin D  supplements.  Taking a calcium supplement. Your caregiver will suggest what dose is best for you. HOME CARE INSTRUCTIONS  Take any supplements that your caregiver prescribes. Follow the directions carefully. Take only the suggested amount.  Have your blood tested 2 months after you start taking supplements.  Eat foods that contain vitamin D. Healthy choices include:  Fortified dairy products, cereals, or juices. Fortified means vitamin D has been added to the food. Check the label on the package to be sure.  Fatty fish like salmon or trout.  Eggs.  Oysters.  Do not use a tanning bed.  Keep your weight at a healthy level. Lose weight if you need to.  Keep all follow-up appointments. Your caregiver will need to perform blood tests to make sure your vitamin D deficiency is going away. SEEK MEDICAL CARE IF:  You have any questions about your treatment.  You continue to have symptoms of vitamin D deficiency.  You have nausea or vomiting.  You are constipated.  You feel confused.  You have severe abdominal or back pain. MAKE SURE YOU:  Understand these instructions.  Will watch your condition.  Will get help right away if you are not doing well or get worse. Document Released: 03/30/2011 Document Revised: 05/02/2012 Document Reviewed: 03/30/2011 [facility] Patient Information 2014 [place], [place].

## 2013-01-05 NOTE — Progress Notes (Signed)
Patient ID: Amy Moss, female   DOB: 1940/01/22, 72 y.o.   MRN: [id number]  Annual Screening Comprehensive Examination  This very nice 72 y.o. female presents for complete physical.  Patient has been followed for HTN,ASCVD/CVA, COPD, AAA, Depression, DJD,  Hx/o abnormal glucose, Hyperlipidemia, and Vitamin D Deficiency.   Patient's BP  is apparently controlled. She does have moderate renal insufficiency by with last BUN/Creat 19/1.63 and calc GFR 31 in October. Patient denies any cardiac symptoms as chest pain, palpitations, dizziness or ankle swelling. Patient is known to have an Abdominal and Thoracic Aortic Aneurysm and was referred by Dr Clent Demark to [facility] for surgical evaluation and surgery was deferred until patient can stop smoking and her then elevated BP was in better control - which now seems to be in control on her current regimen.   Patient's hyperlipidemia is controlled with diet. Patient denies myalgias or other medication SE's. Last cholesterol last visit was  177, triglycerides 123, HDL  43 and LDL 109- near goal in October.     Patient has COPD and refuses to consider stopping smoking. She denies dyspnea at her current lifestyle low level of activity and also denies any cough or sputum production.   Finally, patient has history of Vitamin D Deficiency  With values ranging from 14 - 17-24 and most recently 36 in October as she refuses to take Vitamin D supplements regularly.     Medication Sig Dispense Refill  . albuterol (PROVENTIL HFA;VENTOLIN HFA) 108 (90 BASE) MCG/ACT inhaler Inhale 2 puffs into the lungs every 6 (six) hours as needed. For shortness of breath      . B Complex Vitamins (VITAMIN B COMPLEX PO) Take 1 tablet by mouth daily.      . Cetirizine HCl (ZYRTEC PO) Take by mouth as needed.      . Cholecalciferol (VITAMIN D-3 PO) Take 1,200 Units by mouth daily.      . diazepam (VALIUM) 10 MG tablet Take 10 mg by mouth every 8 (eight) hours. For anxiety       . enalapril (VASOTEC) 20 MG tablet Take 20 mg by mouth 2 (two) times daily.      Marland Kitchen HYDROcodone-acetaminophen (NORCO/VICODIN) 5-325 MG per tablet Take 1 tablet by mouth every 4 (four) hours as needed for pain.  10 tablet  0  . HYDROcodone-acetaminophen (NORCO/VICODIN) 5-325 MG per tablet Take 1-2 tablets by mouth every 4 (four) hours as needed for pain.  25 tablet  0  . methocarbamol (ROBAXIN) 500 MG tablet Take 1 tablet (500 mg total) by mouth 2 (two) times daily.  20 tablet  0  . methocarbamol (ROBAXIN) 500 MG tablet Take 1 tablet (500 mg total) by mouth 2 (two) times daily.  20 tablet  0  . metoprolol (LOPRESSOR) 50 MG tablet Take 50 mg by mouth 2 (two) times daily.      . traMADol (ULTRAM) 50 MG tablet Take 50 mg by mouth 2 (two) times daily as needed. For pain        Allergies  Allergen Reactions  . Nicotrol [Nicotine Polacrilex]     Past Medical History  Diagnosis Date  . Hypertension   . Stroke   . Hypercholesterolemia   . AAA (abdominal aortic aneurysm) without rupture   . Anemia   . Diverticulosis   . Hemorrhoids   . Lower GI bleed   . Depression   . Brain stem stroke syndrome   . DJD (degenerative joint disease)   .  Fatty liver 08/30/09  . COPD (chronic obstructive pulmonary disease)     Past Surgical History  Procedure Laterality Date  . Laminectomy      x 4  . Knee surgery    . Abdominal hysterectomy    . Eye surgery    . Cataract extraction, bilateral    . Shoulder surgery      Family History  Problem Relation Age of Onset  . Heart disease Sister   . Heart disease Brother   . Cancer Sister   . Alzheimer's disease Mother   . CVA Father     History  Substance Use Topics  . Smoking status: Current Every Day Smoker -- 1.00 packs/day    Types: Cigarettes  . Smokeless tobacco: Never Used  . Alcohol Use: No    ROS Constitutional: Denies fever, chills, weight loss/gain, headaches, insomnia, fatigue, night sweats, and change in appetite. Eyes: Denies  redness, blurred vision, diplopia, discharge, itchy, watery eyes.  ENT: Denies discharge, congestion, post nasal drip, epistaxis, sore throat, earache, hearing loss, dental pain, Tinnitus, Vertigo, Sinus pain, snoring.  Cardio: Denies chest pain, palpitations, irregular heartbeat, syncope, dyspnea, diaphoresis, orthopnea, PND, claudication, edema Respiratory: Above.  Gastrointestinal: Denies dysphagia, heartburn, reflux, water brash, pain, cramps, nausea, vomiting, bloating, diarrhea, constipation, hematemesis, melena, hematochezia, jaundice, hemorrhoids Genitourinary: Denies dysuria, frequency, urgency, nocturia, hesitancy, discharge, hematuria, flank pain Breast:Breast lumps, nipple discharge, bleeding.  Musculoskeletal: Complains of chronic Low Back Pain. Skin: Denies puritis, rash, hives, warts, acne, eczema, changing in skin lesion Neuro: No weakness, tremor, incoordination, spasms, paresthesia, pain Psychiatric: Denies confusion, memory loss, sensory loss Endocrine: Denies change in weight, skin, hair change, nocturia, and paresthesia, diabetic polys, visual blurring, hyper / hypo glycemic episodes.  Heme/Lymph: No excessive bleeding, bruising, enlarged lymph nodes.  Filed Vitals:   01/05/13 1511  BP: 128/72  Pulse: 72  Temp: 97.9 F (36.6 C)  Resp: 16    Estimated body mass index is 21.09 kg/(m^2) as calculated from the following:   Height as of this encounter: 5' 6.75" (1.695 m).   Weight as of this encounter: 133 lb 9.6 oz (60.601 kg).  Physical Exam General Appearance: Adequately nourished, but chronically ill and in acute apparent distress. Eyes: PERRLA, EOMs, conjunctiva no swelling or erythema, normal fundi and vessels. Sinuses: No frontal/maxillary tenderness ENT/Mouth: EACs patent / TMs  nl. Nares clear without erythema, swelling, mucoid exudates. Oral hygiene is good. No erythema, swelling, or exudate. Tongue normal, non-obstructing. Tonsils not swollen or erythematous.  Hearing normal.  Neck: Supple, thyroid normal. No bruits, nodes or JVD. Respiratory: Respiratory effort normal.  BS distant equal and clear bilateral with few scattered rales and rhonci, but no wheezing or stridor. Cardio: Heart sounds are normal with regular rate and rhythm and no murmurs, rubs or gallops. Peripheral pulses are normal and equal bilaterally without edema. No aortic or femoral bruits. Chest: symmetric with normal excursions and percussion. Breasts: Symmetric, without lumps, nipple discharge, retractions, or fibrocystic changes.  Abdomen: Flat, soft, with bowl sounds. Nontender, no guarding, rebound, hernias, masses, or organomegaly.  Lymphatics: Non tender without lymphadenopathy.  Genitourinary:  Musculoskeletal: Full ROM all peripheral extremities, joint stability, 5/5 strength, and normal gait. Skin: Warm and dry without rashes, lesions, cyanosis, clubbing or  ecchymosis.  Neuro: Cranial nerves intact, reflexes equal bilaterally. Normal muscle tone, no cerebellar symptoms. Sensation intact.  Pysch: Awake and oriented X 3, normal affect, Insight and Judgment appropriate.   Assessment and Plan  1. Annual Screening Examination 2. Hypertension  3. Hyperlipidemia 4. Abnormal Glucose, Hx/o 5. Vitamin D Deficiency 6. ASCVD/ Hxo CVA 7. COPD 8. Thoraco-Abdomilal Aortic Aneurysm 9. DJD- Chronic Lumbago  Continue prudent diet as discussed, weight control, BP monitoring, regular exercise, and medications. Discussed med's effects and SE's. Screening labs and tests as requested with regular follow-up as recommended.

## 2013-01-06 LAB — TSH: TSH: 2.8 u[IU]/mL (ref 0.350–4.500)

## 2013-01-09 ENCOUNTER — Telehealth: Payer: Self-pay | Admitting: *Deleted

## 2013-01-09 NOTE — Telephone Encounter (Signed)
Message copied by Reggy Eye on Mon Jan 09, 2013 12:19 PM ------      Message from: Lucky Cowboy      Created: Sat Jan 07, 2013 12:13 PM       Cbc is ok - kidney functions suggest need to drink lots more fluids -liver and mag are ok      Chol 189 good but bad chol - ldl is high at 115 (ideal less 70) recc lower chol diet - avoid meat esp red meat and dairy esp cheese      Thyroid is nl/ok      A1c ic normal/ok ------

## 2013-01-16 ENCOUNTER — Other Ambulatory Visit: Payer: Self-pay | Admitting: Physician Assistant

## 2013-01-16 DIAGNOSIS — I10 Essential (primary) hypertension: Secondary | ICD-10-CM

## 2013-01-16 MED ORDER — MINOXIDIL 10 MG PO TABS: 5.0000 mg | ORAL_TABLET | Freq: Two times a day (BID) | ORAL | Status: DC

## 2013-01-24 ENCOUNTER — Other Ambulatory Visit: Payer: Self-pay | Admitting: Emergency Medicine

## 2013-01-24 MED ORDER — TRAMADOL HCL 50 MG PO TABS: 50.0000 mg | ORAL_TABLET | Freq: Two times a day (BID) | ORAL | Status: DC | PRN

## 2013-01-28 ENCOUNTER — Emergency Department (HOSPITAL_COMMUNITY)
Admission: EM | Admit: 2013-01-28 | Discharge: 2013-01-28 | Disposition: A | Discharge status: Home / Self Care | Payer: Medicare HMO | Attending: Emergency Medicine | Admitting: Emergency Medicine

## 2013-01-28 ENCOUNTER — Encounter (HOSPITAL_COMMUNITY): Payer: Self-pay | Admitting: Emergency Medicine

## 2013-01-28 ENCOUNTER — Emergency Department (HOSPITAL_COMMUNITY): Payer: Medicare HMO

## 2013-01-28 DIAGNOSIS — J449 Chronic obstructive pulmonary disease, unspecified: Secondary | ICD-10-CM | POA: Insufficient documentation

## 2013-01-28 DIAGNOSIS — Z8719 Personal history of other diseases of the digestive system: Secondary | ICD-10-CM | POA: Insufficient documentation

## 2013-01-28 DIAGNOSIS — Z862 Personal history of diseases of the blood and blood-forming organs and certain disorders involving the immune mechanism: Secondary | ICD-10-CM | POA: Insufficient documentation

## 2013-01-28 DIAGNOSIS — F17200 Nicotine dependence, unspecified, uncomplicated: Secondary | ICD-10-CM | POA: Insufficient documentation

## 2013-01-28 DIAGNOSIS — W010XXA Fall on same level from slipping, tripping and stumbling without subsequent striking against object, initial encounter: Secondary | ICD-10-CM | POA: Insufficient documentation

## 2013-01-28 DIAGNOSIS — S301XXA Contusion of abdominal wall, initial encounter: Secondary | ICD-10-CM | POA: Insufficient documentation

## 2013-01-28 DIAGNOSIS — S2232XA Fracture of one rib, left side, initial encounter for closed fracture: Secondary | ICD-10-CM

## 2013-01-28 DIAGNOSIS — F329 Major depressive disorder, single episode, unspecified: Secondary | ICD-10-CM | POA: Insufficient documentation

## 2013-01-28 DIAGNOSIS — E7800 Pure hypercholesterolemia, unspecified: Secondary | ICD-10-CM | POA: Insufficient documentation

## 2013-01-28 DIAGNOSIS — S2239XA Fracture of one rib, unspecified side, initial encounter for closed fracture: Secondary | ICD-10-CM | POA: Insufficient documentation

## 2013-01-28 DIAGNOSIS — Z79899 Other long term (current) drug therapy: Secondary | ICD-10-CM | POA: Insufficient documentation

## 2013-01-28 DIAGNOSIS — N390 Urinary tract infection, site not specified: Secondary | ICD-10-CM | POA: Insufficient documentation

## 2013-01-28 DIAGNOSIS — I10 Essential (primary) hypertension: Secondary | ICD-10-CM | POA: Insufficient documentation

## 2013-01-28 DIAGNOSIS — Z8739 Personal history of other diseases of the musculoskeletal system and connective tissue: Secondary | ICD-10-CM | POA: Insufficient documentation

## 2013-01-28 DIAGNOSIS — J4489 Other specified chronic obstructive pulmonary disease: Secondary | ICD-10-CM | POA: Insufficient documentation

## 2013-01-28 DIAGNOSIS — Y929 Unspecified place or not applicable: Secondary | ICD-10-CM | POA: Insufficient documentation

## 2013-01-28 DIAGNOSIS — F3289 Other specified depressive episodes: Secondary | ICD-10-CM | POA: Insufficient documentation

## 2013-01-28 DIAGNOSIS — Y939 Activity, unspecified: Secondary | ICD-10-CM | POA: Insufficient documentation

## 2013-01-28 DIAGNOSIS — Z8673 Personal history of transient ischemic attack (TIA), and cerebral infarction without residual deficits: Secondary | ICD-10-CM | POA: Insufficient documentation

## 2013-01-28 LAB — URINALYSIS, ROUTINE W REFLEX MICROSCOPIC
BILIRUBIN URINE: NEGATIVE
Glucose, UA: NEGATIVE mg/dL
Ketones, ur: NEGATIVE mg/dL
NITRITE: POSITIVE — AB
PROTEIN: NEGATIVE mg/dL
SPECIFIC GRAVITY, URINE: 1.025 (ref 1.005–1.030)
UROBILINOGEN UA: 0.2 mg/dL (ref 0.0–1.0)
pH: 5 (ref 5.0–8.0)

## 2013-01-28 LAB — COMPREHENSIVE METABOLIC PANEL
ALBUMIN: 3.5 g/dL (ref 3.5–5.2)
ALK PHOS: 69 U/L (ref 39–117)
ALT: 7 U/L (ref 0–35)
AST: 17 U/L (ref 0–37)
BUN: 28 mg/dL — AB (ref 6–23)
CALCIUM: 9.1 mg/dL (ref 8.4–10.5)
CO2: 25 mEq/L (ref 19–32)
Chloride: 99 mEq/L (ref 96–112)
Creatinine, Ser: 1.67 mg/dL — ABNORMAL HIGH (ref 0.50–1.10)
GFR calc non Af Amer: 29 mL/min — ABNORMAL LOW (ref >90)
GFR, EST AFRICAN AMERICAN: 34 mL/min — AB (ref >90)
GLUCOSE: 106 mg/dL — AB (ref 70–99)
POTASSIUM: 5 meq/L (ref 3.7–5.3)
SODIUM: 137 meq/L (ref 137–147)
TOTAL PROTEIN: 7.2 g/dL (ref 6.0–8.3)
Total Bilirubin: 0.4 mg/dL (ref 0.3–1.2)

## 2013-01-28 LAB — CBC WITH DIFFERENTIAL/PLATELET
Basophils Absolute: 0 10*3/uL (ref 0.0–0.1)
Basophils Relative: 0 % (ref 0–1)
EOS PCT: 2 % (ref 0–5)
Eosinophils Absolute: 0.2 10*3/uL (ref 0.0–0.7)
HEMATOCRIT: 38.9 % (ref 36.0–46.0)
HEMOGLOBIN: 13.4 g/dL (ref 12.0–15.0)
LYMPHS ABS: 1.8 10*3/uL (ref 0.7–4.0)
LYMPHS PCT: 19 % (ref 12–46)
MCH: 31.9 pg (ref 26.0–34.0)
MCHC: 34.4 g/dL (ref 30.0–36.0)
MCV: 92.6 fL (ref 78.0–100.0)
MONO ABS: 1 10*3/uL (ref 0.1–1.0)
MONOS PCT: 10 % (ref 3–12)
NEUTROS ABS: 6.6 10*3/uL (ref 1.7–7.7)
Neutrophils Relative %: 69 % (ref 43–77)
Platelets: 172 10*3/uL (ref 150–400)
RBC: 4.2 MIL/uL (ref 3.87–5.11)
RDW: 12.7 % (ref 11.5–15.5)
WBC: 9.6 10*3/uL (ref 4.0–10.5)

## 2013-01-28 LAB — URINE MICROSCOPIC-ADD ON

## 2013-01-28 MED ORDER — HYDROCODONE-ACETAMINOPHEN 5-325 MG PO TABS: 1.0000 | ORAL_TABLET | Freq: Four times a day (QID) | ORAL | Status: DC | PRN

## 2013-01-28 MED ORDER — CEPHALEXIN 500 MG PO CAPS
500.0000 mg | ORAL_CAPSULE | Freq: Four times a day (QID) | ORAL | Status: DC
Start: 2013-01-28 — End: 2013-04-10

## 2013-01-28 MED ORDER — TRAMADOL HCL 50 MG PO TABS
50.0000 mg | ORAL_TABLET | Freq: Once | ORAL | Status: AC
  Administered 2013-01-28: 50 mg via ORAL
  Filled 2013-01-28: qty 1

## 2013-01-28 NOTE — ED Provider Notes (Signed)
Medical screening examination/treatment/procedure(s) were conducted as a shared visit with non-physician practitioner(s) and myself.  I personally evaluated the patient during the encounter.  EKG Interpretation   None         Candyce ChurnJohn David Braydon Kullman, MD 01/28/13 [phone]

## 2013-01-28 NOTE — Discharge Instructions (Signed)
Urinary Tract Infection Urinary tract infections (UTIs) can develop anywhere along your urinary tract. Your urinary tract is your body's drainage system for removing wastes and extra water. Your urinary tract includes two kidneys, two ureters, a bladder, and a urethra. Your kidneys are a pair of bean-shaped organs. Each kidney is about the size of your fist. They are located below your ribs, one on each side of your spine. CAUSES Infections are caused by microbes, which are microscopic organisms, including fungi, viruses, and bacteria. These organisms are so small that they can only be seen through a microscope. Bacteria are the microbes that most commonly cause UTIs. SYMPTOMS  Symptoms of UTIs may vary by age and gender of the patient and by the location of the infection. Symptoms in young women typically include a frequent and intense urge to urinate and a painful, burning feeling in the bladder or urethra during urination. Older women and men are more likely to be tired, shaky, and weak and have muscle aches and abdominal pain. A fever may mean the infection is in your kidneys. Other symptoms of a kidney infection include pain in your back or sides below the ribs, nausea, and vomiting. DIAGNOSIS To diagnose a UTI, your caregiver will ask you about your symptoms. Your caregiver also will ask to provide a urine sample. The urine sample will be tested for bacteria and white blood cells. White blood cells are made by your body to help fight infection. TREATMENT  Typically, UTIs can be treated with medication. Because most UTIs are caused by a bacterial infection, they usually can be treated with the use of antibiotics. The choice of antibiotic and length of treatment depend on your symptoms and the type of bacteria causing your infection. HOME CARE INSTRUCTIONS  If you were prescribed antibiotics, take them exactly as your caregiver instructs you. Finish the medication even if you feel better after you  have only taken some of the medication.  Drink enough water and fluids to keep your urine clear or pale yellow.  Avoid caffeine, tea, and carbonated beverages. They tend to irritate your bladder.  Empty your bladder often. Avoid holding urine for long periods of time.  Empty your bladder before and after sexual intercourse.  After a bowel movement, women should cleanse from front to back. Use each tissue only once. SEEK MEDICAL CARE IF:   You have back pain.  You develop a fever.  Your symptoms do not begin to resolve within 3 days. SEEK IMMEDIATE MEDICAL CARE IF:   You have severe back pain or lower abdominal pain.  You develop chills.  You have nausea or vomiting.  You have continued burning or discomfort with urination. MAKE SURE YOU:   Understand these instructions.  Will watch your condition.  Will get help right away if you are not doing well or get worse. Document Released: 10/15/2004 Document Revised: 07/07/2011 Document Reviewed: 02/13/2011 [facility] Patient Information 2014 [place], [place]. Rib Fracture A rib fracture is a break or crack in one of the bones of the ribs. The ribs are a group of long, curved bones that wrap around your chest and attach to your spine. They protect your lungs and other organs in the chest cavity. A broken or cracked rib is often painful, but most do not cause other problems. Most rib fractures heal on their own over time. However, rib fractures can be more serious if multiple ribs are broken or if broken ribs move out of place and push against other  structures. CAUSES   A direct blow to the chest. For example, this could happen during contact sports, a car accident, or a fall against a hard object.  Repetitive movements with high force, such as pitching a baseball or having severe coughing spells. SYMPTOMS   Pain when you breathe in or cough.  Pain when someone presses on the injured area. DIAGNOSIS  Your caregiver will perform a  physical exam. Various imaging tests may be ordered to confirm the diagnosis and to look for related injuries. These tests may include a chest X-ray, computed tomography (CT), magnetic resonance imaging (MRI), or a bone scan. TREATMENT  Rib fractures usually heal on their own in 1 3 months. The longer healing period is often associated with a continued cough or other aggravating activities. During the healing period, pain control is very important. Medication is usually given to control pain. Hospitalization or surgery may be needed for more severe injuries, such as those in which multiple ribs are broken or the ribs have moved out of place.  HOME CARE INSTRUCTIONS   Avoid strenuous activity and any activities or movements that cause pain. Be careful during activities and avoid bumping the injured rib.  Gradually increase activity as directed by your caregiver.  Only take over-the-counter or prescription medications as directed by your caregiver. Do not take other medications without asking your caregiver first.  Apply ice to the injured area for the first 1 2 days after you have been treated or as directed by your caregiver. Applying ice helps to reduce inflammation and pain.  Put ice in a plastic bag.  Place a towel between your skin and the bag.   Leave the ice on for 15 20 minutes at a time, every 2 hours while you are awake.  Perform deep breathing as directed by your caregiver. This will help prevent pneumonia, which is a common complication of a broken rib. Your caregiver may instruct you to:  Take deep breaths several times a day.  Try to cough several times a day, holding a pillow against the injured area.  Use a device called an incentive spirometer to practice deep breathing several times a day.  Drink enough fluids to keep your urine clear or pale yellow. This will help you avoid constipation.   Do not wear a rib belt or binder. These restrict breathing, which can lead to  pneumonia.  SEEK IMMEDIATE MEDICAL CARE IF:   You have a fever.   You have difficulty breathing or shortness of breath.   You develop a continual cough, or you cough up thick or bloody sputum.  You feel sick to your stomach (nausea), throw up (vomit), or have abdominal pain.   You have worsening pain not controlled with medications.  MAKE SURE YOU:  Understand these instructions.  Will watch your condition.  Will get help right away if you are not doing well or get worse. Document Released: 01/05/2005 Document Revised: 09/07/2012 Document Reviewed: 03/09/2012 [facility] [place] Patient Information 2014 [place], [place].

## 2013-01-28 NOTE — ED Notes (Signed)
Patient states that she is unable to give sample

## 2013-01-28 NOTE — ED Provider Notes (Signed)
Medical screening examination/treatment/procedure(s) were conducted as a shared visit with non-physician practitioner(s) and myself.  I personally evaluated the patient during the encounter.  EKG Interpretation   None       73 yo female who fell five days ago and has been having persistent left chest wall pain since that time. On exam, left lower anterior and lateral chest wall is tender to palpation with a few scattered ecchymoses. Lungs clear to auscultation bilaterally.  Abdomen soft and nontender.Chest x-ray shows rib fracture. Plan outpatient treatment for subacute rib fracture.  Clinical Impression: 1. Rib fracture, left, closed, initial encounter   2. UTI (lower urinary tract infection)       Candyce ChurnJohn David Mckay Brandt, MD 01/28/13 1740

## 2013-01-28 NOTE — ED Notes (Signed)
States she fell down 3 days ago and shes been having abd pain since. Pt denies any bowel/bladder changes. Denies any other pain

## 2013-01-28 NOTE — ED Provider Notes (Signed)
CSN: [id number]     Arrival date & time 01/28/13  1539 History   First MD Initiated Contact with Patient 01/28/13 1558     Chief Complaint  Patient presents with  . Fall   (Consider location/radiation/quality/duration/timing/severity/associated sxs/prior Treatment) HPI Comments: Patient presents to the emergency department with chief complaint of left sided pain. She states that on Monday she tripped over her dog, and fell to the ground landing on her left side. She complains of pain in the left ribs, and left upper abdomen. The fall was mechanical. She has not taken anything to alleviate her symptoms. She denies chest pain, or shortness of breath. Denies dizziness, nausea, vomiting, diarrhea, or constipation. She is chronically incontinent. Denies seeing any blood in her urine or stools. She states that she feels fine other than the left ribs.  The history is provided by the patient. No language interpreter was used.    Past Medical History  Diagnosis Date  . Hypertension   . Stroke   . Hypercholesterolemia   . AAA (abdominal aortic aneurysm) without rupture   . Anemia   . Diverticulosis   . Hemorrhoids   . Lower GI bleed   . Depression   . Brain stem stroke syndrome   . DJD (degenerative joint disease)   . Fatty liver 08/30/09  . COPD (chronic obstructive pulmonary disease)    Past Surgical History  Procedure Laterality Date  . Laminectomy      x 4  . Knee surgery    . Abdominal hysterectomy    . Eye surgery    . Cataract extraction, bilateral    . Shoulder surgery     Family History  Problem Relation Age of Onset  . Heart disease Sister   . Heart disease Brother   . Cancer Sister   . Alzheimer's disease Mother   . CVA Father    History  Substance Use Topics  . Smoking status: Current Every Day Smoker -- 1.00 packs/day    Types: Cigarettes  . Smokeless tobacco: Never Used  . Alcohol Use: No   OB History   Grav Para Term Preterm Abortions TAB SAB Ect Mult Living                  Review of Systems  All other systems reviewed and are negative.    Allergies  Nicotrol  Home Medications   Current Outpatient Rx  Name  Route  Sig  Dispense  Refill  . Cetirizine HCl (ZYRTEC PO)   Oral   Take by mouth as needed.         . citalopram (CELEXA) 20 MG tablet   Oral   Take 1 tablet (20 mg total) by mouth daily. For mood   90 tablet   99   . diazepam (VALIUM) 10 MG tablet      1/2 to 1 tablet 3 x daily for nerves   90 tablet   5   . metoprolol (LOPRESSOR) 50 MG tablet      1/2 tablet 2 x daily for BP   90 tablet   99   . minoxidil (LONITEN) 10 MG tablet   Oral   Take 0.5 tablets (5 mg total) by mouth 2 (two) times daily.   30 tablet   3   . traMADol (ULTRAM) 50 MG tablet   Oral   Take 1 tablet (50 mg total) by mouth 2 (two) times daily as needed. For pain   60 tablet  0    BP 106/59  Pulse 76  Temp(Src) 97.3 F (36.3 C) (Oral)  Resp 17  Wt 133 lb (60.328 kg)  SpO2 99% Physical Exam  Nursing note and vitals reviewed. Constitutional: She is oriented to person, place, and time. She appears well-developed and well-nourished.  HENT:  Head: Normocephalic and atraumatic.  Eyes: Conjunctivae and EOM are normal. Pupils are equal, round, and reactive to light.  Neck: Normal range of motion. Neck supple.  Cardiovascular: Normal rate and regular rhythm.  Exam reveals no gallop and no friction rub.   No murmur heard. Pulmonary/Chest: Effort normal and breath sounds normal. No respiratory distress. She has no wheezes. She has no rales. She exhibits no tenderness.  Left inferior ribs are tender to palpation, no bony abnormality or deformity  Abdominal: Soft. She exhibits no distension and no mass. There is no tenderness. There is no rebound and no guarding.  Left upper abdomen is moderately tender to palpation, no left lower abdominal tenderness, no right-sided abdominal tenderness  Musculoskeletal: Normal range of motion. She  exhibits no edema and no tenderness.  Neurological: She is alert and oriented to person, place, and time.  Skin: Skin is warm and dry.  Ecchymosis to the left lower abdomen  Psychiatric: She has a normal mood and affect. Her behavior is normal. Judgment and thought content normal.    ED Course  Procedures (including critical care time) Results for orders placed during the hospital encounter of 01/28/13  URINALYSIS, ROUTINE W REFLEX MICROSCOPIC      Result Value Range   Color, Urine YELLOW  YELLOW   APPearance CLOUDY (*) CLEAR   Specific Gravity, Urine 1.025  1.005 - 1.030   pH 5.0  5.0 - 8.0   Glucose, UA NEGATIVE  NEGATIVE mg/dL   Hgb urine dipstick TRACE (*) NEGATIVE   Bilirubin Urine NEGATIVE  NEGATIVE   Ketones, ur NEGATIVE  NEGATIVE mg/dL   Protein, ur NEGATIVE  NEGATIVE mg/dL   Urobilinogen, UA 0.2  0.0 - 1.0 mg/dL   Nitrite POSITIVE (*) NEGATIVE   Leukocytes, UA SMALL (*) NEGATIVE  COMPREHENSIVE METABOLIC PANEL      Result Value Range   Sodium 137  137 - 147 mEq/L   Potassium 5.0  3.7 - 5.3 mEq/L   Chloride 99  96 - 112 mEq/L   CO2 25  19 - 32 mEq/L   Glucose, Bld 106 (*) 70 - 99 mg/dL   BUN 28 (*) 6 - 23 mg/dL   Creatinine, Ser 9.56 (*) 0.50 - 1.10 mg/dL   Calcium 9.1  8.4 - 21.3 mg/dL   Total Protein 7.2  6.0 - 8.3 g/dL   Albumin 3.5  3.5 - 5.2 g/dL   AST 17  0 - 37 U/L   ALT 7  0 - 35 U/L   Alkaline Phosphatase 69  39 - 117 U/L   Total Bilirubin 0.4  0.3 - 1.2 mg/dL   GFR calc non Af Amer 29 (*) >90 mL/min   GFR calc Af Amer 34 (*) >90 mL/min  CBC WITH DIFFERENTIAL      Result Value Range   WBC 9.6  4.0 - 10.5 K/uL   RBC 4.20  3.87 - 5.11 MIL/uL   Hemoglobin 13.4  12.0 - 15.0 g/dL   HCT 08.6  57.8 - 46.9 %   MCV 92.6  78.0 - 100.0 fL   MCH 31.9  26.0 - 34.0 pg   MCHC 34.4  30.0 - 36.0 g/dL  RDW 12.7  11.5 - 15.5 %   Platelets 172  150 - 400 K/uL   Neutrophils Relative % 69  43 - 77 %   Neutro Abs 6.6  1.7 - 7.7 K/uL   Lymphocytes Relative 19  12 - 46  %   Lymphs Abs 1.8  0.7 - 4.0 K/uL   Monocytes Relative 10  3 - 12 %   Monocytes Absolute 1.0  0.1 - 1.0 K/uL   Eosinophils Relative 2  0 - 5 %   Eosinophils Absolute 0.2  0.0 - 0.7 K/uL   Basophils Relative 0  0 - 1 %   Basophils Absolute 0.0  0.0 - 0.1 K/uL  URINE MICROSCOPIC-ADD ON      Result Value Range   Squamous Epithelial / LPF FEW (*) RARE   WBC, UA 7-10  <3 WBC/hpf   RBC / HPF 0-2  <3 RBC/hpf   Bacteria, UA MANY (*) RARE   Casts GRANULAR CAST (*) NEGATIVE   Dg Ribs Unilateral W/chest Left  01/28/2013   CLINICAL DATA:  Fall left rib pain  EXAM: LEFT RIBS AND CHEST - 3+ VIEW  COMPARISON:  12/29/2011  FINDINGS: Five views left ribs submitted. Again noted aneurysmal dilatation of ascending aorta. No acute infiltrate or pulmonary edema. There is minimal displaced fracture of the left 10th rib adjacent to the skin marker. No diagnostic pneumothorax.  IMPRESSION: Minimal displaced fracture of the left 10th rib at the level of skin marker.   Electronically Signed   By: Natasha MeadLiviu  Pop M.D.   On: 01/28/2013 17:26      EKG Interpretation   None       MDM   1. Rib fracture, left, closed, initial encounter   2. UTI (lower urinary tract infection)     Patient with left rib pain. Suffered a mechanical fall on Monday. Check basic labs, chest x-ray with ribs, and will reevaluate.  Left rib fracture at the 10th rib. Patient also has a UTI. Will treat with pain medicine, Keflex, and will give the patient's parameters. Follow up with primary care.  5:06 PM Patient seen by and discussed with Dr. Loretha StaplerWofford, who agrees with plan.  Roxy Horsemanobert Itzia Cunliffe, PA-C 01/28/13 1735

## 2013-01-30 LAB — URINE CULTURE

## 2013-02-02 ENCOUNTER — Other Ambulatory Visit: Payer: Self-pay | Admitting: Internal Medicine

## 2013-02-24 ENCOUNTER — Other Ambulatory Visit: Payer: Self-pay | Admitting: Emergency Medicine

## 2013-02-24 ENCOUNTER — Other Ambulatory Visit: Payer: Self-pay | Admitting: Internal Medicine

## 2013-02-24 MED ORDER — TRAMADOL HCL 50 MG PO TABS: 50.0000 mg | ORAL_TABLET | Freq: Two times a day (BID) | ORAL | Status: DC | PRN

## 2013-02-24 NOTE — Telephone Encounter (Signed)
Rx called into pharm 

## 2013-03-18 ENCOUNTER — Other Ambulatory Visit: Payer: Self-pay | Admitting: Emergency Medicine

## 2013-03-20 ENCOUNTER — Other Ambulatory Visit: Payer: Self-pay | Admitting: Physician Assistant

## 2013-03-20 ENCOUNTER — Other Ambulatory Visit: Payer: Self-pay | Admitting: Emergency Medicine

## 2013-03-20 DIAGNOSIS — I10 Essential (primary) hypertension: Secondary | ICD-10-CM

## 2013-03-20 MED ORDER — MINOXIDIL 2.5 MG PO TABS: 2.5000 mg | ORAL_TABLET | Freq: Two times a day (BID) | ORAL | Status: DC

## 2013-04-10 ENCOUNTER — Encounter: Payer: Self-pay | Admitting: Physician Assistant

## 2013-04-10 ENCOUNTER — Ambulatory Visit (INDEPENDENT_AMBULATORY_CARE_PROVIDER_SITE_OTHER): Payer: Medicare HMO | Admitting: Physician Assistant

## 2013-04-10 VITALS — BP 114/78 | HR 80 | Temp 98.2°F | Resp 18 | Ht 66.75 in | Wt 137.0 lb

## 2013-04-10 DIAGNOSIS — F3289 Other specified depressive episodes: Secondary | ICD-10-CM

## 2013-04-10 DIAGNOSIS — E559 Vitamin D deficiency, unspecified: Secondary | ICD-10-CM

## 2013-04-10 DIAGNOSIS — I10 Essential (primary) hypertension: Secondary | ICD-10-CM

## 2013-04-10 DIAGNOSIS — Z79899 Other long term (current) drug therapy: Secondary | ICD-10-CM

## 2013-04-10 DIAGNOSIS — F329 Major depressive disorder, single episode, unspecified: Secondary | ICD-10-CM

## 2013-04-10 DIAGNOSIS — I714 Abdominal aortic aneurysm, without rupture, unspecified: Secondary | ICD-10-CM

## 2013-04-10 DIAGNOSIS — R7309 Other abnormal glucose: Secondary | ICD-10-CM

## 2013-04-10 DIAGNOSIS — B370 Candidal stomatitis: Secondary | ICD-10-CM

## 2013-04-10 DIAGNOSIS — H538 Other visual disturbances: Secondary | ICD-10-CM

## 2013-04-10 DIAGNOSIS — E782 Mixed hyperlipidemia: Secondary | ICD-10-CM

## 2013-04-10 DIAGNOSIS — F32A Depression, unspecified: Secondary | ICD-10-CM

## 2013-04-10 DIAGNOSIS — J449 Chronic obstructive pulmonary disease, unspecified: Secondary | ICD-10-CM

## 2013-04-10 LAB — HEPATIC FUNCTION PANEL
ALK PHOS: 60 U/L (ref 39–117)
ALT: 10 U/L (ref 0–35)
AST: 17 U/L (ref 0–37)
Albumin: 4.1 g/dL (ref 3.5–5.2)
BILIRUBIN DIRECT: 0.1 mg/dL (ref 0.0–0.3)
BILIRUBIN INDIRECT: 0.3 mg/dL (ref 0.2–1.2)
Total Bilirubin: 0.4 mg/dL (ref 0.2–1.2)
Total Protein: 6.8 g/dL (ref 6.0–8.3)

## 2013-04-10 LAB — BASIC METABOLIC PANEL WITH GFR
BUN: 26 mg/dL — AB (ref 6–23)
CHLORIDE: 104 meq/L (ref 96–112)
CO2: 27 mEq/L (ref 19–32)
Calcium: 9.3 mg/dL (ref 8.4–10.5)
Creat: 1.54 mg/dL — ABNORMAL HIGH (ref 0.50–1.10)
GFR, EST NON AFRICAN AMERICAN: 33 mL/min — AB
GFR, Est African American: 38 mL/min — ABNORMAL LOW
Glucose, Bld: 73 mg/dL (ref 70–99)
POTASSIUM: 5.1 meq/L (ref 3.5–5.3)
Sodium: 138 mEq/L (ref 135–145)

## 2013-04-10 LAB — CBC WITH DIFFERENTIAL/PLATELET
BASOS ABS: 0 10*3/uL (ref 0.0–0.1)
Basophils Relative: 0 % (ref 0–1)
Eosinophils Absolute: 0.2 10*3/uL (ref 0.0–0.7)
Eosinophils Relative: 3 % (ref 0–5)
HEMATOCRIT: 40.1 % (ref 36.0–46.0)
HEMOGLOBIN: 13.8 g/dL (ref 12.0–15.0)
LYMPHS PCT: 28 % (ref 12–46)
Lymphs Abs: 2 10*3/uL (ref 0.7–4.0)
MCH: 31.2 pg (ref 26.0–34.0)
MCHC: 34.4 g/dL (ref 30.0–36.0)
MCV: 90.7 fL (ref 78.0–100.0)
MONO ABS: 0.6 10*3/uL (ref 0.1–1.0)
Monocytes Relative: 8 % (ref 3–12)
Neutro Abs: 4.3 10*3/uL (ref 1.7–7.7)
Neutrophils Relative %: 61 % (ref 43–77)
Platelets: 183 10*3/uL (ref 150–400)
RBC: 4.42 MIL/uL (ref 3.87–5.11)
RDW: 13.5 % (ref 11.5–15.5)
WBC: 7.1 10*3/uL (ref 4.0–10.5)

## 2013-04-10 LAB — LIPID PANEL
CHOL/HDL RATIO: 4.5 ratio
Cholesterol: 175 mg/dL (ref 0–200)
HDL: 39 mg/dL — AB (ref >39)
LDL CALC: 97 mg/dL (ref 0–99)
Triglycerides: 196 mg/dL — ABNORMAL HIGH (ref <150)
VLDL: 39 mg/dL (ref 0–40)

## 2013-04-10 LAB — MAGNESIUM: Magnesium: 2.2 mg/dL (ref 1.5–2.5)

## 2013-04-10 MED ORDER — NYSTATIN 100000 UNIT/ML MT SUSP: OROMUCOSAL | Status: DC

## 2013-04-10 NOTE — Patient Instructions (Signed)
Smoking Cessation Quitting smoking is important to your health and has many advantages. However, it is not always easy to quit since nicotine is a very addictive drug. Often times, people try 3 times or more before being able to quit. This document explains the best ways for you to prepare to quit smoking. Quitting takes hard work and a lot of effort, but you can do it. ADVANTAGES OF QUITTING SMOKING  You will live longer, feel better, and live better.  Your body will feel the impact of quitting smoking almost immediately.  Within 20 minutes, blood pressure decreases. Your pulse returns to its normal level.  After 8 hours, carbon monoxide levels in the blood return to normal. Your oxygen level increases.  After 24 hours, the chance of having a heart attack starts to decrease. Your breath, hair, and body stop smelling like smoke.  After 48 hours, damaged nerve endings begin to recover. Your sense of taste and smell improve.  After 72 hours, the body is virtually free of nicotine. Your bronchial tubes relax and breathing becomes easier.  After 2 to 12 weeks, lungs can hold more air. Exercise becomes easier and circulation improves.  The risk of having a heart attack, stroke, cancer, or lung disease is greatly reduced.  After 1 year, the risk of coronary heart disease is cut in half.  After 5 years, the risk of stroke falls to the same as a nonsmoker.  After 10 years, the risk of lung cancer is cut in half and the risk of other cancers decreases significantly.  After 15 years, the risk of coronary heart disease drops, usually to the level of a nonsmoker.  If you are pregnant, quitting smoking will improve your chances of having a healthy baby.  The people you live with, especially any children, will be healthier.  You will have extra money to spend on things other than cigarettes. QUESTIONS TO THINK ABOUT BEFORE ATTEMPTING TO QUIT You may want to talk about your answers with your  caregiver.  Why do you want to quit?  If you tried to quit in the past, what helped and what did not?  What will be the most difficult situations for you after you quit? How will you plan to handle them?  Who can help you through the tough times? Your family? Friends? A caregiver?  What pleasures do you get from smoking? What ways can you still get pleasure if you quit? Here are some questions to ask your caregiver:  How can you help me to be successful at quitting?  What medicine do you think would be best for me and how should I take it?  What should I do if I need more help?  What is smoking withdrawal like? How can I get information on withdrawal? GET READY  Set a quit date.  Change your environment by getting rid of all cigarettes, ashtrays, matches, and lighters in your home, car, or work. Do not let people smoke in your home.  Review your past attempts to quit. Think about what worked and what did not. GET SUPPORT AND ENCOURAGEMENT You have a better chance of being successful if you have help. You can get support in many ways.  Tell your family, friends, and co-workers that you are going to quit and need their support. Ask them not to smoke around you.  Get individual, group, or telephone counseling and support. Programs are available at local hospitals and health centers. Call your local health department for   information about programs in your area.  Spiritual beliefs and practices may help some smokers quit.  Download a "quit meter" on your computer to keep track of quit statistics, such as how long you have gone without smoking, cigarettes not smoked, and money saved.  Get a self-help book about quitting smoking and staying off of tobacco. LEARN NEW SKILLS AND BEHAVIORS  Distract yourself from urges to smoke. Talk to someone, go for a walk, or occupy your time with a task.  Change your normal routine. Take a different route to work. Drink tea instead of coffee.  Eat breakfast in a different place.  Reduce your stress. Take a hot bath, exercise, or read a book.  Plan something enjoyable to do every day. Reward yourself for not smoking.  Explore interactive web-based programs that specialize in helping you quit. GET MEDICINE AND USE IT CORRECTLY Medicines can help you stop smoking and decrease the urge to smoke. Combining medicine with the above behavioral methods and support can greatly increase your chances of successfully quitting smoking.  Nicotine replacement therapy helps deliver nicotine to your body without the negative effects and risks of smoking. Nicotine replacement therapy includes nicotine gum, lozenges, inhalers, nasal sprays, and skin patches. Some may be available over-the-counter and others require a prescription.  Antidepressant medicine helps people abstain from smoking, but how this works is unknown. This medicine is available by prescription.  Nicotinic receptor partial agonist medicine simulates the effect of nicotine in your brain. This medicine is available by prescription. Ask your caregiver for advice about which medicines to use and how to use them based on your health history. Your caregiver will tell you what side effects to look out for if you choose to be on a medicine or therapy. Carefully read the information on the package. Do not use any other product containing nicotine while using a nicotine replacement product.  RELAPSE OR DIFFICULT SITUATIONS Most relapses occur within the first 3 months after quitting. Do not be discouraged if you start smoking again. Remember, most people try several times before finally quitting. You may have symptoms of withdrawal because your body is used to nicotine. You may crave cigarettes, be irritable, feel very hungry, cough often, get headaches, or have difficulty concentrating. The withdrawal symptoms are only temporary. They are strongest when you first quit, but they will go away within  10 14 days. To reduce the chances of relapse, try to:  Avoid drinking alcohol. Drinking lowers your chances of successfully quitting.  Reduce the amount of caffeine you consume. Once you quit smoking, the amount of caffeine in your body increases and can give you symptoms, such as a rapid heartbeat, sweating, and anxiety.  Avoid smokers because they can make you want to smoke.  Do not let weight gain distract you. Many smokers will gain weight when they quit, usually less than 10 pounds. Eat a healthy diet and stay active. You can always lose the weight gained after you quit.  Find ways to improve your mood other than smoking. FOR MORE INFORMATION  www.smokefree.gov  Document Released: 12/30/2000 Document Revised: 07/07/2011 Document Reviewed: 04/16/2011 [facility] Patient Information 2014 [place], [place]. Thrush, Adult  Ginette Pitman is an infection that can happen on the mouth, throat, tongue, or other areas. It causes white patches to form on the mouth and tongue. HOME CARE  Only take medicine as told by your doctor. You may be given medicine to swallow or to apply right on the area.  Eat plain yogurt  that contains live cultures (check the label).  Rinse your mouth many times a day with a warm saltwater rinse. To make the rinse, mix 1 teaspoon (6 g) of salt in 8 ounces (0.2 L) of warm water. To reduce pain:  Drink cold liquids such as water or iced tea.  Eat frozen ice pops or frozen juices.  Eat foods that are easy to swallow, such as gelatin or ice cream.  Drink from a straw if the patches are painful. If you are breastfeeding:  Clean your nipples with an antifungal medicine.  Dry your nipples after breastfeeding.  Use an ointment called lanolin to help relieve nipple soreness. If you wear dentures:  Take out your dentures before going to bed.  Brush them thoroughly.  Soak them in a denture cleaner. GET HELP IF:   Your problems are getting worse.  Your problems are not  improving within 7 days of starting treatment.  Your infection is spreading. This may show as white patches on the skin outside of your mouth.  You are nursing and have redness and pain in the nipples. MAKE SURE YOU:  Understand these instructions.  Will watch your condition.  Will get help right away if you are not doing well or get worse. Document Released: 04/01/2009 Document Revised: 10/26/2012 Document Reviewed: 08/08/2012 [facility] Patient Information 2014 [place].

## 2013-04-10 NOTE — Progress Notes (Signed)
HPI 73 y.o. female  presents for 3 month follow up with hypertension, hyperlipidemia, prediabetes and vitamin D. Her blood pressure has been controlled at home, today their BP is BP: 114/78 mmHg She does not workout. She denies chest pain, shortness of breath, dizziness.  She is not on cholesterol medication and denies myalgias. Her cholesterol is not at goal. The cholesterol last visit was:   Lab Results  Component Value Date   CHOL 189 01/05/2013   HDL 42 01/05/2013   LDLCALC 115* 01/05/2013   TRIG 159* 01/05/2013   CHOLHDL 4.5 01/05/2013    Last A1C in the office was:  Lab Results  Component Value Date   HGBA1C 5.4 01/05/2013   Patient is on Vitamin D supplement.   She was in the ER in Jan for a fractured rib after a fall. DUE for DEXA 12/2014.  She is a chronic smoker, declines smoking cessation.   Current Medications:  Current Outpatient Prescriptions on File Prior to Visit  Medication Sig Dispense Refill  . citalopram (CELEXA) 20 MG tablet TAKE 1 TABLET BY MOUTH EVERY DAY FOR MOOD AND PAIN  90 tablet  1  . diazepam (VALIUM) 10 MG tablet TAKE 1 TABLET BY MOUTH 3 TIMES A DAY AS NEEDED  90 tablet  1  . HYDROcodone-acetaminophen (NORCO/VICODIN) 5-325 MG per tablet Take 1 tablet by mouth every 6 (six) hours as needed.  15 tablet  0  . ibuprofen (ADVIL,MOTRIN) 200 MG tablet Take 400-800 mg by mouth every 6 (six) hours as needed for moderate pain.      . metoprolol (LOPRESSOR) 50 MG tablet Take 25 mg by mouth 2 (two) times daily.      . minoxidil (LONITEN) 2.5 MG tablet Take 1 tablet (2.5 mg total) by mouth 2 (two) times daily.  60 tablet  3  . traMADol (ULTRAM) 50 MG tablet TAKE 1 TABLET BY MOUTH TWICE A DAY  60 tablet  0   No current facility-administered medications on file prior to visit.   Medical History:  Past Medical History  Diagnosis Date  . Hypertension   . Stroke   . Hypercholesterolemia   . AAA (abdominal aortic aneurysm) without rupture   . Anemia   .  Diverticulosis   . Hemorrhoids   . Lower GI bleed   . Depression   . Brain stem stroke syndrome   . DJD (degenerative joint disease)   . Fatty liver 08/30/09  . COPD (chronic obstructive pulmonary disease)    Allergies: No Known Allergies   Review of Systems: [X]  = complains of  [ ]  = denies  General: Fatigue [ ]  Fever [ ]  Chills [ ]  Weakness [ ]   Insomnia [ ]  Eyes: Redness [ ]  Blurred vision [ ]  Diplopia [ ]   ENT: Congestion [ ]  Sinus Pain [ ]  Post Nasal Drip [ ]  Sore Throat [ ]  Earache [ ]   Cardiac: Chest pain/pressure [ ]  SOB [ ]  Orthopnea [ ]   Palpitations [ ]   Paroxysmal nocturnal dyspnea[ ]  Claudication [ ]  Edema [ ]   Pulmonary: Cough [ ]  Wheezing[ ]   SOB [ ]   Snoring [ ]   GI: Nausea [ ]  Vomiting[ ]  Dysphagia[ ]  Heartburn[ ]  Abdominal pain [ ]  Constipation [ ] ; Diarrhea [ ] ; BRBPR [ ]  Melena[ ]  GU: Hematuria[ ]  Dysuria [ ]  Nocturia[ ]  Urgency [ ]   Hesitancy [ ]  Discharge [ ]  Neuro: Headaches[ ]  Vertigo[ ]  Paresthesias[ ]  Spasm [ ]  Speech changes [ ]  Incoordination [ ]   Ortho: Arthritis [ ]  Joint pain [ ]  Muscle pain [ ]  Joint swelling [ ]  Back Pain [ ]  Skin:  Rash [ ]   Pruritis [ ]  Change in skin lesion [ ]   Psych: Depression[ ]  Anxiety[ ]  Confusion [ ]  Memory loss [ ]   Heme/Lypmh: Bleeding [ ]  Bruising [ ]  Enlarged lymph nodes [ ]   Endocrine: Visual blurring [ ]  Paresthesia [ ]  Polyuria [ ]  Polydypsea [ ]    Heat/cold intolerance [ ]  Hypoglycemia [ ]   Family history- Review and unchanged Social history- Review and unchanged Physical Exam: BP 114/78  Pulse 80  Temp(Src) 98.2 F (36.8 C) (Temporal)  Resp 18  Ht 5' 6.75" (1.695 m)  Wt 137 lb (62.143 kg)  BMI 21.63 kg/m2 Wt Readings from Last 3 Encounters:  04/10/13 137 lb (62.143 kg)  01/28/13 133 lb (60.328 kg)  01/05/13 133 lb 9.6 oz (60.601 kg)   General Appearance: Well nourished, in no apparent distress. Eyes: PERRLA, EOMs, conjunctiva no swelling or erythema Sinuses: No Frontal/maxillary tenderness ENT/Mouth:  Ext aud canals clear, TMs without erythema, bulging. No erythema, swelling, or exudate on post pharynx.  Tonsils not swollen or erythematous. Hearing normal. Tongue with white plaques.  Neck: Supple, thyroid normal.  Respiratory: Respiratory effort decreased, BS equal bilaterally without rales, rhonchi, wheezing or stridor.  Cardio: RRR with no MRGs. Brisk peripheral pulses without edema.  Abdomen: Soft, + BS.  Non tender, no guarding, rebound, hernias, masses. Lymphatics: Non tender without lymphadenopathy.  Musculoskeletal: Full ROM, 5/5 strength, normal gait.  Skin: Warm, dry without rashes, lesions, ecchymosis.  Neuro: Cranial nerves intact. Normal muscle tone, no cerebellar symptoms. Sensation intact.  Psych: Awake and oriented X 3, normal affect, Insight and Judgment appropriate.   Assessment and Plan:  Hypertension: Continue medication, monitor blood pressure at home.  Continue DASH diet. Cholesterol: Continue diet and exercise. Check cholesterol.  Pre-diabetes-Continue diet and exercise. Check A1C Vitamin D Def- check level and continue medications.  Yeast, tongue- mouth wash Eye doctor referral. Smoking cessation discussed, she is not ready to quit at this time.   OVER 40 minutes of exam, counseling, chart review, referral performed Continue diet and meds as discussed. Further disposition pending results of labs.  Quentin Mullingollier, Olof Marcil 4:01 PM

## 2013-04-11 LAB — VITAMIN D 25 HYDROXY (VIT D DEFICIENCY, FRACTURES): Vit D, 25-Hydroxy: 54 ng/mL (ref 30–89)

## 2013-04-11 LAB — TSH: TSH: 2.001 u[IU]/mL (ref 0.350–4.500)

## 2013-04-26 ENCOUNTER — Other Ambulatory Visit: Payer: Self-pay | Admitting: Physician Assistant

## 2013-04-26 ENCOUNTER — Other Ambulatory Visit: Payer: Self-pay | Admitting: Emergency Medicine

## 2013-05-29 ENCOUNTER — Other Ambulatory Visit: Payer: Self-pay | Admitting: Physician Assistant

## 2013-07-05 ENCOUNTER — Other Ambulatory Visit: Payer: Self-pay | Admitting: Physician Assistant

## 2013-07-10 ENCOUNTER — Ambulatory Visit (INDEPENDENT_AMBULATORY_CARE_PROVIDER_SITE_OTHER): Payer: Commercial Managed Care - HMO | Admitting: Internal Medicine

## 2013-07-10 ENCOUNTER — Encounter: Payer: Self-pay | Admitting: Internal Medicine

## 2013-07-10 VITALS — BP 110/72 | HR 64 | Temp 98.1°F | Resp 16 | Ht 66.75 in | Wt 134.8 lb

## 2013-07-10 DIAGNOSIS — R7309 Other abnormal glucose: Secondary | ICD-10-CM

## 2013-07-10 DIAGNOSIS — I10 Essential (primary) hypertension: Secondary | ICD-10-CM

## 2013-07-10 DIAGNOSIS — E782 Mixed hyperlipidemia: Secondary | ICD-10-CM

## 2013-07-10 DIAGNOSIS — Z79899 Other long term (current) drug therapy: Secondary | ICD-10-CM

## 2013-07-10 DIAGNOSIS — E559 Vitamin D deficiency, unspecified: Secondary | ICD-10-CM

## 2013-07-10 LAB — CBC WITH DIFFERENTIAL/PLATELET
BASOS ABS: 0 10*3/uL (ref 0.0–0.1)
BASOS PCT: 0 % (ref 0–1)
Eosinophils Absolute: 0.2 10*3/uL (ref 0.0–0.7)
Eosinophils Relative: 2 % (ref 0–5)
HCT: 40.4 % (ref 36.0–46.0)
Hemoglobin: 14 g/dL (ref 12.0–15.0)
Lymphocytes Relative: 34 % (ref 12–46)
Lymphs Abs: 2.9 10*3/uL (ref 0.7–4.0)
MCH: 31 pg (ref 26.0–34.0)
MCHC: 34.7 g/dL (ref 30.0–36.0)
MCV: 89.4 fL (ref 78.0–100.0)
Monocytes Absolute: 0.7 10*3/uL (ref 0.1–1.0)
Monocytes Relative: 8 % (ref 3–12)
Neutro Abs: 4.8 10*3/uL (ref 1.7–7.7)
Neutrophils Relative %: 56 % (ref 43–77)
PLATELETS: 178 10*3/uL (ref 150–400)
RBC: 4.52 MIL/uL (ref 3.87–5.11)
RDW: 13.4 % (ref 11.5–15.5)
WBC: 8.6 10*3/uL (ref 4.0–10.5)

## 2013-07-10 NOTE — Progress Notes (Signed)
Patient ID: Amy Moss, female   DOB: June 24, 1940, 73 y.o.   MRN: [id number]   This very nice 73 y.o.WWF presents for 3 month follow up with Hypertension, ASCVD/CVA,Hx, AAA, COPD, DJD Hyperlipidemia, Pre-Diabetes and Vitamin D Deficiency.    HTN predates since the 1980's. BP has been controlled at home. Today's BP: 110/72 mmHg. Patient has Stage 3 CKD with GFR 33 ml/min. Patient denies any cardiac type chest pain, palpitations, dyspnea/orthopnea/PND, dizziness, claudication, or dependent edema.   Hyperlipidemia is controlled with diet & meds.  Patient denies myalgias or other med SE's. Last Lipids as below are at goal.  Lab Results  Component Value Date   CHOL 175 04/10/2013   HDL 39* 04/10/2013   LDLCALC 97 04/10/2013   TRIG 196* 04/10/2013   CHOLHDL 4.5 04/10/2013    Also, the patient has history of abnormal glucose and is screened forPreDiabetes/insulin resistance and last A1c was 5.4%. Patient denies any symptoms of reactive hypoglycemia, diabetic polys, paresthesias or visual blurring.   Further, Patient has history of Vitamin D Deficiency  Of 17 in 2017and last vitamin D was 54 in Mar 2015. Patient supplements vitamin D without any suspected side-effects.   Medication List   citalopram 20 MG tablet  Commonly known as:  CELEXA  TAKE 1 TABLET BY MOUTH EVERY DAY FOR MOOD AND PAIN     diazepam 10 MG tablet  Commonly known as:  VALIUM  TAKE 1 TABLET BY MOUTH 3 TIMES A DAY AS NEEDED     ibuprofen 200 MG tablet  Commonly known as:  ADVIL,MOTRIN  Take 400-800 mg by mouth every 6 (six) hours as needed for moderate pain.     metoprolol 50 MG tablet  Commonly known as:  LOPRESSOR  Take 25 mg by mouth 2 (two) times daily.     minoxidil 2.5 MG tablet  Commonly known as:  LONITEN  Take 1 tablet (2.5 mg total) by mouth 2 (two) times daily.     traMADol 50 MG tablet  Commonly known as:  ULTRAM  TAKE 1 TABLET BY MOUTH TWICE A DAY        Enalapril 20 mg bid   No Known Allergies  PMHx:    Past Medical History  Diagnosis Date  . Hypertension   . Stroke   . Hypercholesterolemia   . AAA (abdominal aortic aneurysm) without rupture   . Anemia   . Diverticulosis   . Hemorrhoids   . Lower GI bleed   . Depression   . Brain stem stroke syndrome   . DJD (degenerative joint disease)   . Fatty liver 08/30/09  . COPD (chronic obstructive pulmonary disease)    FHx:    Reviewed / unchanged  SHx:    Reviewed / unchanged   Systems Review: Constitutional: Denies fever, chills, wt changes, headaches, insomnia, fatigue, night sweats, change in appetite. Eyes: Denies redness, blurred vision, diplopia, discharge, itchy, watery eyes.  ENT: Denies discharge, congestion, post nasal drip, epistaxis, sore throat, earache, hearing loss, dental pain, tinnitus, vertigo, sinus pain, snoring.  CV: Denies chest pain, palpitations, irregular heartbeat, syncope, dyspnea, diaphoresis, orthopnea, PND, claudication or edema. Respiratory: denies cough, dyspnea, DOE, pleurisy, hoarseness, laryngitis, wheezing.  Gastrointestinal: Denies dysphagia, odynophagia, heartburn, reflux, water brash, abdominal pain or cramps, nausea, vomiting, bloating, diarrhea, constipation, hematemesis, melena, hematochezia  or hemorrhoids. Genitourinary: Denies dysuria, frequency, urgency, nocturia, hesitancy, discharge, hematuria or flank pain. Musculoskeletal: Denies arthralgias, myalgias, stiffness, jt. swelling, pain, limping or strain/sprain.  Skin: Denies pruritus, rash,  hives, warts, acne, eczema or change in skin lesion(s). Neuro: No weakness, tremor, incoordination, spasms, paresthesia or pain. Psychiatric: Denies confusion, memory loss or sensory loss. Endo: Denies change in weight, skin or hair change.  Heme/Lymph: No excessive bleeding, bruising or enlarged lymph nodes.  Exam:  BP 110/72  Pulse 64  Temp 98.1 F   Resp 16  Ht 5' 6.75"   Wt 134 lb 12.8 oz   BMI 21.28 kg/m2  Appears well nourished - in no  distress. Eyes: PERRLA, EOMs, conjunctiva no swelling or erythema. Sinuses: No frontal/maxillary tenderness ENT/Mouth: EAC's clear, TM's nl w/o erythema, bulging. Nares clear w/o erythema, swelling, exudates. Oropharynx clear without erythema or exudates. Oral hygiene is good. Tongue normal, non obstructing. Hearing intact.  Neck: Supple. Thyroid nl. Car 2+/2+ without bruits, nodes or JVD. Chest: Respirations nl with BS clear & equal w/o rales, rhonchi, wheezing or stridor.  Cor: Heart sounds normal w/ regular rate and rhythm without sig. murmurs, gallops, clicks, or rubs. Peripheral pulses normal and equal  without edema.  Abdomen: Soft & bowel sounds normal. Non-tender w/o guarding, rebound, hernias, masses, or organomegaly.  Lymphatics: Unremarkable.  Musculoskeletal: Generalized decrease in muscle power, tone & bulk consistant with age and deconditionoing. Full ROM all peripheral extremities, joint stability, 5/5 strength, and normal gait.  Skin: Warm, dry without exposed rashes, lesions or ecchymosis apparent.  Neuro: Cranial nerves intact, reflexes equal bilaterally. Sensory-motor testing grossly intact. Tendon reflexes grossly intact.  Pysch: Alert & oriented x 3. Insight and judgement nl & appropriate. No ideations.  Assessment and Plan:  1. Hypertension - Continue monitor blood pressure at home. Continue diet/meds same.  2. Hyperlipidemia - Continue diet/meds, exercise,& lifestyle modifications. Continue monitor periodic cholesterol/liver & renal functions   3. Pre-diabetes/Insulin Resistance - Continue screening - Continue diet, exercise, lifestyle modifications. Monitor appropriate labs.  4. Vitamin D Deficiency - Continue supplementation.  5. ASCVD/CVA,Hx  Recommended regular exercise, BP monitoring, weight control, and discussed med and SE's. Recommended labs to assess and monitor clinical status. Further disposition pending results of labs.

## 2013-07-11 LAB — HEPATIC FUNCTION PANEL
ALBUMIN: 4.2 g/dL (ref 3.5–5.2)
ALT: <8 U/L (ref 0–35)
AST: 15 U/L (ref 0–37)
Alkaline Phosphatase: 64 U/L (ref 39–117)
BILIRUBIN TOTAL: 0.5 mg/dL (ref 0.2–1.2)
Bilirubin, Direct: 0.1 mg/dL (ref 0.0–0.3)
Indirect Bilirubin: 0.4 mg/dL (ref 0.2–1.2)
TOTAL PROTEIN: 6.9 g/dL (ref 6.0–8.3)

## 2013-07-11 LAB — BASIC METABOLIC PANEL WITH GFR
BUN: 27 mg/dL — ABNORMAL HIGH (ref 6–23)
CALCIUM: 9.8 mg/dL (ref 8.4–10.5)
CO2: 27 mEq/L (ref 19–32)
CREATININE: 1.54 mg/dL — AB (ref 0.50–1.10)
Chloride: 103 mEq/L (ref 96–112)
GFR, EST NON AFRICAN AMERICAN: 33 mL/min — AB
GFR, Est African American: 38 mL/min — ABNORMAL LOW
Glucose, Bld: 85 mg/dL (ref 70–99)
Potassium: 4.5 mEq/L (ref 3.5–5.3)
Sodium: 139 mEq/L (ref 135–145)

## 2013-07-11 LAB — LIPID PANEL
Cholesterol: 172 mg/dL (ref 0–200)
HDL: 41 mg/dL (ref >39)
LDL Cholesterol: 87 mg/dL (ref 0–99)
TRIGLYCERIDES: 220 mg/dL — AB (ref <150)
Total CHOL/HDL Ratio: 4.2 Ratio
VLDL: 44 mg/dL — ABNORMAL HIGH (ref 0–40)

## 2013-07-11 LAB — MAGNESIUM: Magnesium: 2.1 mg/dL (ref 1.5–2.5)

## 2013-07-11 LAB — INSULIN, FASTING: INSULIN FASTING, SERUM: 8 u[IU]/mL (ref 3–28)

## 2013-07-11 LAB — TSH: TSH: 1.233 u[IU]/mL (ref 0.350–4.500)

## 2013-07-11 LAB — HEMOGLOBIN A1C
Hgb A1c MFr Bld: 5.5 % (ref <5.7)
MEAN PLASMA GLUCOSE: 111 mg/dL (ref <117)

## 2013-07-11 LAB — VITAMIN D 25 HYDROXY (VIT D DEFICIENCY, FRACTURES): Vit D, 25-Hydroxy: 55 ng/mL (ref 30–89)

## 2013-07-30 ENCOUNTER — Other Ambulatory Visit: Payer: Self-pay | Admitting: Physician Assistant

## 2013-08-27 ENCOUNTER — Other Ambulatory Visit: Payer: Self-pay | Admitting: Emergency Medicine

## 2013-09-01 ENCOUNTER — Other Ambulatory Visit: Payer: Self-pay | Admitting: Emergency Medicine

## 2013-09-03 ENCOUNTER — Other Ambulatory Visit: Payer: Self-pay | Admitting: Emergency Medicine

## 2013-09-10 ENCOUNTER — Other Ambulatory Visit: Payer: Self-pay | Admitting: Emergency Medicine

## 2013-09-15 ENCOUNTER — Other Ambulatory Visit: Payer: Self-pay | Admitting: Emergency Medicine

## 2013-10-10 ENCOUNTER — Encounter: Payer: Self-pay | Admitting: Physician Assistant

## 2013-10-10 ENCOUNTER — Ambulatory Visit (HOSPITAL_COMMUNITY)
Admission: RE | Admit: 2013-10-10 | Discharge: 2013-10-10 | Disposition: A | Discharge status: Home / Self Care | Payer: Medicare HMO | Source: Ambulatory Visit | Attending: Physician Assistant | Admitting: Physician Assistant

## 2013-10-10 ENCOUNTER — Ambulatory Visit (INDEPENDENT_AMBULATORY_CARE_PROVIDER_SITE_OTHER): Payer: Commercial Managed Care - HMO | Admitting: Physician Assistant

## 2013-10-10 VITALS — BP 138/82 | HR 72 | Temp 98.6°F | Resp 16 | Ht 66.0 in | Wt 131.0 lb

## 2013-10-10 DIAGNOSIS — J449 Chronic obstructive pulmonary disease, unspecified: Secondary | ICD-10-CM

## 2013-10-10 DIAGNOSIS — J40 Bronchitis, not specified as acute or chronic: Secondary | ICD-10-CM | POA: Insufficient documentation

## 2013-10-10 DIAGNOSIS — F17200 Nicotine dependence, unspecified, uncomplicated: Secondary | ICD-10-CM

## 2013-10-10 DIAGNOSIS — J4489 Other specified chronic obstructive pulmonary disease: Secondary | ICD-10-CM | POA: Insufficient documentation

## 2013-10-10 DIAGNOSIS — E782 Mixed hyperlipidemia: Secondary | ICD-10-CM

## 2013-10-10 DIAGNOSIS — Z79899 Other long term (current) drug therapy: Secondary | ICD-10-CM

## 2013-10-10 DIAGNOSIS — I10 Essential (primary) hypertension: Secondary | ICD-10-CM

## 2013-10-10 DIAGNOSIS — R7309 Other abnormal glucose: Secondary | ICD-10-CM

## 2013-10-10 DIAGNOSIS — I714 Abdominal aortic aneurysm, without rupture, unspecified: Secondary | ICD-10-CM

## 2013-10-10 DIAGNOSIS — E559 Vitamin D deficiency, unspecified: Secondary | ICD-10-CM

## 2013-10-10 DIAGNOSIS — M549 Dorsalgia, unspecified: Secondary | ICD-10-CM

## 2013-10-10 MED ORDER — DIAZEPAM 10 MG PO TABS: ORAL_TABLET | ORAL | Status: DC

## 2013-10-10 NOTE — Progress Notes (Signed)
Assessment and Plan:  Hypertension: Continue medication, monitor blood pressure at home. Continue DASH diet. Cholesterol: Continue diet and exercise. Check cholesterol.  Pre-diabetes-Continue diet and exercise. Check A1C Vitamin D Def- check level and continue medications.  Atypical left shoulder blade/back pain? Muskuloskeletal/GERD/AAA/Heart/COPD- bilateral arm BP the same, no CP/SOB however with extensive CAD history and AAA will get CXR and refer to cardiology. Refuses EKG in the office. Get on PPI, Go to the ER if any CP, SOB, nausea, dizziness, severe HA, changes vision/speech Anxiety- refill valium  Continue diet and meds as discussed. Further disposition pending results of labs.  HPI 73 y.o. female  presents for 3 month follow up with hypertension, hyperlipidemia, prediabetes and vitamin D. Her blood pressure has been controlled at home, today their BP is BP: 138/82 mmHg She does not workout. She denies chest pain, shortness of breath, dizziness.  She is on cholesterol medication and denies myalgias. Her cholesterol is at goal. The cholesterol last visit was:   Lab Results  Component Value Date   CHOL 172 07/10/2013   HDL 41 07/10/2013   LDLCALC 87 07/10/2013   TRIG 220* 07/10/2013   CHOLHDL 4.2 07/10/2013   . Last A1C in the office was:  Lab Results  Component Value Date   HGBA1C 5.5 07/10/2013   Patient is on Vitamin D supplement.   Lab Results  Component Value Date   VD25OH 64 07/10/2013     She continues to smoke but has cut back, she has COPD.  She is complaining of left shoulder blade pain, constant but worse with lying down, some radiation to her left neck if she lies a certain, denies SOB, dizziness, CP, nausea, diaphoresis.  She has history of CVA.   CT AB IMPRESSION:  1. 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  The distal descending thoracic saccular component has increased  from 3.9 to 4.8 cm diameter since 2007.  The  dominant abdominal aortic component is stable in maximal  diameter at 4.5 cm.  2. 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  3. Scattered right femoropopliteal occlusive disease with  contiguous peroneal runoff.  4. 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  5. 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  She has been under a lot of stress, her only grandson died in sept suddenly and her middle daughter that lives with her has been diagnosed with stage 3 lung cancer. She is on the celexa  daily and she has been taking the valium some days 4 a day and has run out.   Lab Results  Component Value Date   CREATININE 1.54* 07/10/2013   BUN 27* 07/10/2013   NA 139 07/10/2013   K 4.5 07/10/2013   CL 103 07/10/2013   CO2 27 07/10/2013     Current Medications:  Current Outpatient Prescriptions on File Prior to Visit  Medication Sig Dispense Refill  . aspirin 325 MG tablet Take 325 mg by mouth.      . citalopram (CELEXA) 20 MG tablet TAKE 1 TABLET BY MOUTH EVERY DAY FOR MOOD AND PAIN  90 tablet  1  . diazepam (VALIUM) 10 MG tablet TAKE 1 TABLET BY MOUTH 3 TIMES A DAY AS NEEDED  90 tablet  3  . enalapril (VASOTEC) 20 MG tablet Take 20 mg by mouth.      Marland Kitchen ibuprofen (ADVIL,MOTRIN) 200 MG tablet Take 400-800 mg by mouth every 6 (  six) hours as needed for moderate pain.      . metoprolol (LOPRESSOR) 50 MG tablet Take 25 mg by mouth 2 (two) times daily.      . minoxidil (LONITEN) 2.5 MG tablet Take 1 tablet (2.5 mg total) by mouth 2 (two) times daily.  60 tablet  3  . traMADol (ULTRAM) 50 MG tablet TAKE 1 TABLET BY MOUTH TWICE A DAY  60 tablet  1   No current facility-administered medications on file prior to visit.   Medical History:  Past Medical History  Diagnosis Date  . Hypertension   . Stroke   . Hypercholesterolemia   . AAA (abdominal aortic aneurysm) without rupture   . Anemia   .  Diverticulosis   . Hemorrhoids   . Lower GI bleed   . Depression   . Brain stem stroke syndrome   . DJD (degenerative joint disease)   . Fatty liver 08/30/09  . COPD (chronic obstructive pulmonary disease)    Allergies: No Known Allergies   Review of Systems:  = complains of   = denies  General: Fatigue  Fever  Chills  Weakness   Insomnia  Eyes: Redness  Blurred vision  Diplopia   ENT: Congestion  Sinus Pain  Post Nasal Drip  Sore Throat  Earache   Cardiac: Chest pain/pressure [x ] SOB  Orthopnea   Palpitations   Paroxysmal nocturnal dyspnea[ ]  Claudication  Edema   Pulmonary: Cough  Wheezing[ ]   SOB   Snoring   GI: Nausea  Vomiting[ ]  Dysphagia[ ]  Heartburn[ ]  Abdominal pain  Constipation ; Diarrhea ; BRBPR  Melena[ ]  GU: Hematuria[ ]  Dysuria  Nocturia[ ]  Urgency   Hesitancy  Discharge  Neuro: Headaches[ ]  Vertigo[ ]  Paresthesias[ ]  Spasm  Speech changes  Incoordination   Ortho: Arthritis  Joint pain  Muscle pain [x ] Joint swelling  Back Pain [x ] Skin:  Rash   Pruritis  Change in skin lesion   Psych: Depression[ ]  Anxiety[x ] Confusion  Memory loss   Heme/Lypmh: Bleeding  Bruising  Enlarged lymph nodes   Endocrine: Visual blurring  Paresthesia  Polyuria  Polydypsea    Heat/cold intolerance  Hypoglycemia   Family history- Review and unchanged Social history- Review and unchanged Physical Exam: BP 138/82  Pulse 72  Temp(Src) 98.6 F (37 C)  Resp 16  Ht  (1.676 m)  Wt 131 lb (59.421 kg)  BMI 21.15 kg/m2 Wt Readings from Last 3 Encounters:  10/10/13 131 lb (59.421 kg)  07/10/13 134 lb 12.8 oz (61.145 kg)  04/10/13 137 lb (62.143 kg)   General Appearance: Malnourished appearing female Eyes: PERRLA, EOMs, conjunctiva no swelling or erythema Sinuses: No Frontal/maxillary tenderness ENT/Mouth: Ext aud canals clear,  TMs without erythema, bulging. No erythema, swelling, or exudate on post pharynx.  Tonsils not swollen or erythematous. Hearing decreased Neck: Supple, thyroid normal.  Respiratory: Respiratory effort normal, decreased breath sounds on left more than right with wheezing at bilateral bases.  Cardio: RRR with systolic murmur. No edema Abdomen: Soft, + BS.  Non tender, no guarding, rebound, hernias, masses. Lymphatics: Non tender without lymphadenopathy.  Musculoskeletal: Full ROM, 5/5 strength, normal gait.  Skin: Warm, dry  without rashes, lesions, ecchymosis.  Neuro: Cranial nerves intact. Normal muscle tone, no cerebellar symptoms.  Psych: Awake and oriented X 2, normal affect.    Quentin Mulling 3:39 PM

## 2013-10-10 NOTE — Patient Instructions (Signed)
Smoking Cessation Quitting smoking is important to your health and has many advantages. However, it is not always easy to quit since nicotine is a very addictive drug. Oftentimes, people try 3 times or more before being able to quit. This document explains the best ways for you to prepare to quit smoking. Quitting takes hard work and a lot of effort, but you can do it. ADVANTAGES OF QUITTING SMOKING  You will live longer, feel better, and live better.  Your body will feel the impact of quitting smoking almost immediately.  Within 20 minutes, blood pressure decreases. Your pulse returns to its normal level.  After 8 hours, carbon monoxide levels in the blood return to normal. Your oxygen level increases.  After 24 hours, the chance of having a heart attack starts to decrease. Your breath, hair, and body stop smelling like smoke.  After 48 hours, damaged nerve endings begin to recover. Your sense of taste and smell improve.  After 72 hours, the body is virtually free of nicotine. Your bronchial tubes relax and breathing becomes easier.  After 2 to 12 weeks, lungs can hold more air. Exercise becomes easier and circulation improves.  The risk of having a heart attack, stroke, cancer, or lung disease is greatly reduced.  After 1 year, the risk of coronary heart disease is cut in half.  After 5 years, the risk of stroke falls to the same as a nonsmoker.  After 10 years, the risk of lung cancer is cut in half and the risk of other cancers decreases significantly.  After 15 years, the risk of coronary heart disease drops, usually to the level of a nonsmoker.  If you are pregnant, quitting smoking will improve your chances of having a healthy baby.  The people you live with, especially any children, will be healthier.  You will have extra money to spend on things other than cigarettes. QUESTIONS TO THINK ABOUT BEFORE ATTEMPTING TO QUIT You may want to talk about your answers with your  health care provider.  Why do you want to quit?  If you tried to quit in the past, what helped and what did not?  What will be the most difficult situations for you after you quit? How will you plan to handle them?  Who can help you through the tough times? Your family? Friends? A health care provider?  What pleasures do you get from smoking? What ways can you still get pleasure if you quit? Here are some questions to ask your health care provider:  How can you help me to be successful at quitting?  What medicine do you think would be best for me and how should I take it?  What should I do if I need more help?  What is smoking withdrawal like? How can I get information on withdrawal? GET READY  Set a quit date.  Change your environment by getting rid of all cigarettes, ashtrays, matches, and lighters in your home, car, or work. Do not let people smoke in your home.  Review your past attempts to quit. Think about what worked and what did not. GET SUPPORT AND ENCOURAGEMENT You have a better chance of being successful if you have help. You can get support in many ways.  Tell your family, friends, and coworkers that you are going to quit and need their support. Ask them not to smoke around you.  Get individual, group, or telephone counseling and support. Programs are available at local hospitals and health centers. Call   your local health department for information about programs in your area.  Spiritual beliefs and practices may help some smokers quit.  Download a "quit meter" on your computer to keep track of quit statistics, such as how long you have gone without smoking, cigarettes not smoked, and money saved.  Get a self-help book about quitting smoking and staying off tobacco. LEARN NEW SKILLS AND BEHAVIORS  Distract yourself from urges to smoke. Talk to someone, go for a walk, or occupy your time with a task.  Change your normal routine. Take a different route to work.  Drink tea instead of coffee. Eat breakfast in a different place.  Reduce your stress. Take a hot bath, exercise, or read a book.  Plan something enjoyable to do every day. Reward yourself for not smoking.  Explore interactive web-based programs that specialize in helping you quit. GET MEDICINE AND USE IT CORRECTLY Medicines can help you stop smoking and decrease the urge to smoke. Combining medicine with the above behavioral methods and support can greatly increase your chances of successfully quitting smoking.  Nicotine replacement therapy helps deliver nicotine to your body without the negative effects and risks of smoking. Nicotine replacement therapy includes nicotine gum, lozenges, inhalers, nasal sprays, and skin patches. Some may be available over-the-counter and others require a prescription.  Antidepressant medicine helps people abstain from smoking, but how this works is unknown. This medicine is available by prescription.  Nicotinic receptor partial agonist medicine simulates the effect of nicotine in your brain. This medicine is available by prescription. Ask your health care provider for advice about which medicines to use and how to use them based on your health history. Your health care provider will tell you what side effects to look out for if you choose to be on a medicine or therapy. Carefully read the information on the package. Do not use any other product containing nicotine while using a nicotine replacement product.  RELAPSE OR DIFFICULT SITUATIONS Most relapses occur within the first 3 months after quitting. Do not be discouraged if you start smoking again. Remember, most people try several times before finally quitting. You may have symptoms of withdrawal because your body is used to nicotine. You may crave cigarettes, be irritable, feel very hungry, cough often, get headaches, or have difficulty concentrating. The withdrawal symptoms are only temporary. They are strongest  when you first quit, but they will go away within 10-14 days. To reduce the chances of relapse, try to:  Avoid drinking alcohol. Drinking lowers your chances of successfully quitting.  Reduce the amount of caffeine you consume. Once you quit smoking, the amount of caffeine in your body increases and can give you symptoms, such as a rapid heartbeat, sweating, and anxiety.  Avoid smokers because they can make you want to smoke.  Do not let weight gain distract you. Many smokers will gain weight when they quit, usually less than 10 pounds. Eat a healthy diet and stay active. You can always lose the weight gained after you quit.  Find ways to improve your mood other than smoking. FOR MORE INFORMATION  www.smokefree.gov  Document Released: 12/30/2000 Document Revised: 05/22/2013 Document Reviewed: 04/16/2011 [facility] Patient Information 2015 [place], [place]. This information is not intended to replace advice given to you by your health care provider. Make sure you discuss any questions you have with your health care provider.  Chest Pain (Nonspecific) It is often hard to give a diagnosis for the cause of chest pain. There is always a  chance that your pain could be related to something serious, such as a heart attack or a blood clot in the lungs. You need to follow up with your doctor. HOME CARE  If antibiotic medicine was given, take it as directed by your doctor. Finish the medicine even if you start to feel better.  For the next few days, avoid activities that bring on chest pain. Continue physical activities as told by your doctor.  Do not use any tobacco products. This includes cigarettes, chewing tobacco, and e-cigarettes.  Avoid drinking alcohol.  Only take medicine as told by your doctor.  Follow your doctor's suggestions for more testing if your chest pain does not go away.  Keep all doctor visits you made. GET HELP IF:  Your chest pain does not go away, even after  treatment.  You have a rash with blisters on your chest.  You have a fever. GET HELP RIGHT AWAY IF:   You have more pain or pain that spreads to your arm, neck, jaw, back, or belly (abdomen).  You have shortness of breath.  You cough more than usual or cough up blood.  You have very bad back or belly pain.  You feel sick to your stomach (nauseous) or throw up (vomit).  You have very bad weakness.  You pass out (faint).  You have chills. This is an emergency. Do not wait to see if the problems will go away. Call your local emergency services (911 in [place] Do not drive yourself to the hospital. MAKE SURE YOU:   Understand these instructions.  Will watch your condition.  Will get help right away if you are not doing well or get worse. Document Released: 06/24/2007 Document Revised: 01/10/2013 Document Reviewed: 06/24/2007 [facility] Patient Information 2015 [place], [place]. This information is not intended to replace advice given to you by your health care provider. Make sure you discuss any questions you have with your health care provider.

## 2013-10-11 LAB — CBC WITH DIFFERENTIAL/PLATELET
Basophils Absolute: 0 10*3/uL (ref 0.0–0.1)
Basophils Relative: 0 % (ref 0–1)
EOS PCT: 3 % (ref 0–5)
Eosinophils Absolute: 0.2 10*3/uL (ref 0.0–0.7)
HEMATOCRIT: 39.2 % (ref 36.0–46.0)
Hemoglobin: 13.2 g/dL (ref 12.0–15.0)
LYMPHS ABS: 2.1 10*3/uL (ref 0.7–4.0)
LYMPHS PCT: 26 % (ref 12–46)
MCH: 30.7 pg (ref 26.0–34.0)
MCHC: 33.7 g/dL (ref 30.0–36.0)
MCV: 91.2 fL (ref 78.0–100.0)
MONO ABS: 0.8 10*3/uL (ref 0.1–1.0)
Monocytes Relative: 10 % (ref 3–12)
Neutro Abs: 4.8 10*3/uL (ref 1.7–7.7)
Neutrophils Relative %: 61 % (ref 43–77)
Platelets: 190 10*3/uL (ref 150–400)
RBC: 4.3 MIL/uL (ref 3.87–5.11)
RDW: 13.3 % (ref 11.5–15.5)
WBC: 7.9 10*3/uL (ref 4.0–10.5)

## 2013-10-11 LAB — BASIC METABOLIC PANEL WITH GFR
BUN: 18 mg/dL (ref 6–23)
CHLORIDE: 106 meq/L (ref 96–112)
CO2: 24 meq/L (ref 19–32)
CREATININE: 1.3 mg/dL — AB (ref 0.50–1.10)
Calcium: 9.3 mg/dL (ref 8.4–10.5)
GFR, Est African American: 47 mL/min — ABNORMAL LOW
GFR, Est Non African American: 41 mL/min — ABNORMAL LOW
GLUCOSE: 82 mg/dL (ref 70–99)
Potassium: 4.3 mEq/L (ref 3.5–5.3)
Sodium: 140 mEq/L (ref 135–145)

## 2013-10-11 LAB — HEPATIC FUNCTION PANEL
ALK PHOS: 64 U/L (ref 39–117)
ALT: 8 U/L (ref 0–35)
AST: 15 U/L (ref 0–37)
Albumin: 4 g/dL (ref 3.5–5.2)
Bilirubin, Direct: 0.1 mg/dL (ref 0.0–0.3)
Indirect Bilirubin: 0.5 mg/dL (ref 0.2–1.2)
TOTAL PROTEIN: 6.8 g/dL (ref 6.0–8.3)
Total Bilirubin: 0.6 mg/dL (ref 0.2–1.2)

## 2013-10-11 LAB — LIPID PANEL
Cholesterol: 176 mg/dL (ref 0–200)
HDL: 42 mg/dL (ref >39)
LDL Cholesterol: 106 mg/dL — ABNORMAL HIGH (ref 0–99)
Total CHOL/HDL Ratio: 4.2 Ratio
Triglycerides: 139 mg/dL (ref <150)
VLDL: 28 mg/dL (ref 0–40)

## 2013-10-11 LAB — MAGNESIUM: Magnesium: 1.8 mg/dL (ref 1.5–2.5)

## 2013-10-11 LAB — HEMOGLOBIN A1C
HEMOGLOBIN A1C: 5.4 % (ref <5.7)
Mean Plasma Glucose: 108 mg/dL (ref <117)

## 2013-10-11 LAB — VITAMIN D 25 HYDROXY (VIT D DEFICIENCY, FRACTURES): VIT D 25 HYDROXY: 42 ng/mL (ref 30–89)

## 2013-10-11 LAB — TSH: TSH: 1.449 u[IU]/mL (ref 0.350–4.500)

## 2013-10-25 ENCOUNTER — Telehealth: Payer: Self-pay | Admitting: Cardiology

## 2013-10-25 NOTE — Telephone Encounter (Signed)
Closed encounter °

## 2013-11-15 ENCOUNTER — Other Ambulatory Visit: Payer: Self-pay | Admitting: Internal Medicine

## 2013-11-17 ENCOUNTER — Ambulatory Visit: Payer: Medicare Other | Admitting: Cardiology

## 2013-12-29 ENCOUNTER — Other Ambulatory Visit: Payer: Self-pay | Admitting: *Deleted

## 2013-12-29 MED ORDER — DIAZEPAM 10 MG PO TABS: ORAL_TABLET | ORAL | Status: DC

## 2014-01-09 ENCOUNTER — Ambulatory Visit (INDEPENDENT_AMBULATORY_CARE_PROVIDER_SITE_OTHER): Payer: Commercial Managed Care - HMO | Admitting: Internal Medicine

## 2014-01-09 ENCOUNTER — Other Ambulatory Visit: Payer: Self-pay

## 2014-01-09 ENCOUNTER — Encounter: Payer: Self-pay | Admitting: Internal Medicine

## 2014-01-09 VITALS — BP 128/84 | HR 64 | Temp 97.9°F | Resp 16 | Ht 66.5 in | Wt 132.4 lb

## 2014-01-09 DIAGNOSIS — Z0001 Encounter for general adult medical examination with abnormal findings: Secondary | ICD-10-CM

## 2014-01-09 DIAGNOSIS — Z9181 History of falling: Secondary | ICD-10-CM

## 2014-01-09 DIAGNOSIS — R7303 Prediabetes: Secondary | ICD-10-CM

## 2014-01-09 DIAGNOSIS — I714 Abdominal aortic aneurysm, without rupture, unspecified: Secondary | ICD-10-CM

## 2014-01-09 DIAGNOSIS — Z1212 Encounter for screening for malignant neoplasm of rectum: Secondary | ICD-10-CM

## 2014-01-09 DIAGNOSIS — I10 Essential (primary) hypertension: Secondary | ICD-10-CM

## 2014-01-09 DIAGNOSIS — J42 Unspecified chronic bronchitis: Secondary | ICD-10-CM

## 2014-01-09 DIAGNOSIS — E559 Vitamin D deficiency, unspecified: Secondary | ICD-10-CM

## 2014-01-09 DIAGNOSIS — E782 Mixed hyperlipidemia: Secondary | ICD-10-CM

## 2014-01-09 DIAGNOSIS — Z79899 Other long term (current) drug therapy: Secondary | ICD-10-CM

## 2014-01-09 LAB — CBC WITH DIFFERENTIAL/PLATELET
BASOS ABS: 0 10*3/uL (ref 0.0–0.1)
BASOS PCT: 0 % (ref 0–1)
EOS ABS: 0.3 10*3/uL (ref 0.0–0.7)
Eosinophils Relative: 4 % (ref 0–5)
HEMATOCRIT: 36.9 % (ref 36.0–46.0)
Hemoglobin: 12.9 g/dL (ref 12.0–15.0)
Lymphocytes Relative: 24 % (ref 12–46)
Lymphs Abs: 1.9 10*3/uL (ref 0.7–4.0)
MCH: 30.6 pg (ref 26.0–34.0)
MCHC: 35 g/dL (ref 30.0–36.0)
MCV: 87.6 fL (ref 78.0–100.0)
MONOS PCT: 9 % (ref 3–12)
MPV: 10.6 fL (ref 9.4–12.4)
Monocytes Absolute: 0.7 10*3/uL (ref 0.1–1.0)
NEUTROS ABS: 5 10*3/uL (ref 1.7–7.7)
NEUTROS PCT: 63 % (ref 43–77)
PLATELETS: 175 10*3/uL (ref 150–400)
RBC: 4.21 MIL/uL (ref 3.87–5.11)
RDW: 13.3 % (ref 11.5–15.5)
WBC: 8 10*3/uL (ref 4.0–10.5)

## 2014-01-09 NOTE — Progress Notes (Signed)
Patient ID: Amy Moss, female   DOB: 04-23-1940, 73 y.o.   MRN: [id number]   Annual Screening  /Preventative  Comprehensive Examination  This very nice 73 y.o.WWF presents for complete physical.  Patient has been followed for HTN,Prediabetes, Hyperlipidemia, and Vitamin D Deficiency.    HTN predates since [id number]. Patient's BP has been controlled at home and patient denies any cardiac symptoms as chest pain, palpitations, shortness of breath, dizziness or ankle swelling. Today's BP: 128/84 mmHg    Patient's hyperlipidemia is controlled with diet and medications. Patient denies myalgias or other medication SE's. Last lipids were Total  Chol176; HDL  42; LDL  106*; Trig 139 on 10/10/2013.   Patient has prediabetes predating and patient denies reactive hypoglycemic symptoms, visual blurring, diabetic polys, or paresthesias. Last A1c was  5.4% on 10/10/2013.   Finally, patient has history of Vitamin D Deficiency of 17 in 2013 and last Vitamin D was  42 on 10/10/2013.  Medication Sig  . aspirin 325 MG tablet Take 325 mg by mouth.  . citalopram (CELEXA) 20 MG tablet TAKE 1 TABLET BY MOUTH EVERY DAY FOR MOOD AND PAIN  . diazepam (VALIUM) 10 MG tablet TAKE 1 TABLET BY MOUTH 3 TIMES A DAY AS NEEDED  . enalapril (VASOTEC) 20 MG tablet Take 20 mg by mouth.  Marland Kitchen. ibuprofen (ADVIL,MOTRIN) 200 MG tablet Take 400-800 mg by mouth every 6 (six) hours as needed for moderate pain.  . metoprolol (LOPRESSOR) 50 MG tablet TAKE 1 TABLET BY MOUTH TWICE A DAY FOR BLOOD PRESSURE  . minoxidil (LONITEN) 2.5 MG tablet Take 1 tablet (2.5 mg total) by mouth 2 (two) times daily.  . traMADol (ULTRAM) 50 MG tablet TAKE 1 TABLET BY MOUTH TWICE A DAY   No Known Allergies   Past Medical History  Diagnosis Date  . Hypertension   . Stroke   . Hypercholesterolemia   . AAA (abdominal aortic aneurysm) without rupture   . Anemia   . Diverticulosis   . Hemorrhoids   . Lower GI bleed   . Depression   . Brain stem stroke syndrome    . DJD (degenerative joint disease)   . Fatty liver 08/30/09  . COPD (chronic obstructive pulmonary disease)    Health Maintenance  Topic Date Due  . TETANUS/TDAP  03/17/1958  . MAMMOGRAM  03/17/1989  . COLONOSCOPY  Last in 2004  . ZOSTAVAX  03/18/1999  . DEXA SCAN  03/17/2004  . PNEUMOCOCCAL POLYSACCHARIDE VACCINE AGE 11 AND OVER  03/17/2004  . INFLUENZA VACCINE  08/19/2013   Immunization History  Administered Date(s) Administered  . DTaP 07/23/2004   Past Surgical History  Procedure Laterality Date  . Laminectomy      x 4  . Knee surgery    . Abdominal hysterectomy    . Eye surgery    . Cataract extraction, bilateral    . Shoulder surgery     Family History  Problem Relation Age of Onset  . Heart disease Sister   . Heart disease Brother   . Cancer Sister   . Alzheimer's disease Mother   . CVA Father    History  Substance Use Topics  . Smoking status: Current Some Day Smoker    Types: Cigarettes  . Smokeless tobacco: Never Used     Comment: patient states she does not smoke daily, only when she is upset.  . Alcohol Use: No    ROS Constitutional: Denies fever, chills, weight loss/gain, headaches, insomnia, fatigue, night sweats, and  change in appetite. Eyes: Denies redness, blurred vision, diplopia, discharge, itchy, watery eyes.  ENT: Denies discharge, congestion, post nasal drip, epistaxis, sore throat, earache, hearing loss, dental pain, Tinnitus, Vertigo, Sinus pain, snoring.  Cardio: Denies chest pain, palpitations, irregular heartbeat, syncope, dyspnea, diaphoresis, orthopnea, PND, claudication, edema Respiratory: denies cough, dyspnea, DOE, pleurisy, hoarseness, laryngitis, wheezing.  Gastrointestinal: Denies dysphagia, heartburn, reflux, water brash, pain, cramps, nausea, vomiting, bloating, diarrhea, constipation, hematemesis, melena, hematochezia, jaundice, hemorrhoids Genitourinary: Denies dysuria, frequency, urgency, nocturia, hesitancy, discharge,  hematuria, flank pain Breast: Breast lumps, nipple discharge, bleeding.  Musculoskeletal: Denies arthralgia, myalgia, stiffness, Jt. Swelling, pain, limp, and strain/sprain. Denies falls. Skin: Denies puritis, rash, hives, warts, acne, eczema, changing in skin lesion Neuro: No weakness, tremor, incoordination, spasms, paresthesia, pain Psychiatric: Denies confusion, memory loss, sensory loss. Denies Depression. Endocrine: Denies change in weight, skin, hair change, nocturia, and paresthesia, diabetic polys, visual blurring, hyper / hypo glycemic episodes.  Heme/Lymph: No excessive bleeding, bruising, enlarged lymph nodes.  Physical Exam  BP 128/84   Pulse 64  Temp 97.9 F   Resp 16  Ht 5' 6.5"   Wt 132 lb 6.4 oz   BMI 21.05   General Appearance: Well nourished and in no apparent distress. Eyes: PERRLA, EOMs, conjunctiva no swelling or erythema, normal fundi and vessels. Sinuses: No frontal/maxillary tenderness ENT/Mouth: EACs patent / TMs  nl. Nares clear without erythema, swelling, mucoid exudates. Oral hygiene is good. No erythema, swelling, or exudate. Tongue normal, non-obstructing. Tonsils not swollen or erythematous. Hearing normal.  Neck: Supple, thyroid normal. No bruits, nodes or JVD. Respiratory: Respiratory effort normal.  BS equal and clear bilateral without rales, rhonci, wheezing or stridor. Cardio: Heart sounds are normal with regular rate and rhythm and no murmurs, rubs or gallops. Peripheral pulses are normal and equal bilaterally without edema. No aortic or femoral bruits. Chest: symmetric with normal excursions and percussion. Breasts: Symmetric, without lumps, nipple discharge, retractions, or fibrocystic changes.  Abdomen: Flat, soft, with bowl sounds. Nontender, no guarding, rebound, hernias, masses, or organomegaly.  Lymphatics: Non tender without lymphadenopathy.  Genitourinary:  Musculoskeletal: Full ROM all peripheral extremities, joint stability, 5/5  strength, and normal gait. Skin: Warm and dry without rashes, lesions, cyanosis, clubbing or  ecchymosis.  Neuro: Cranial nerves intact, reflexes equal bilaterally. Normal muscle tone, no cerebellar symptoms. Sensation intact.  Pysch: Awake and oriented X 3, normal affect, Insight and Judgment appropriate.   Assessment and Plan  1. Annual Preventative &  Screening Examination 2. Hypertension  3. Hyperlipidemia 4. Pre Diabetes 5. Vitamin D Deficiency 6. AAA   Continue prudent diet as discussed, weight control, BP monitoring, regular exercise, and medications. Discussed med's effects and SE's. Screening labs and tests as requested with regular follow-up as recommended.

## 2014-01-09 NOTE — Patient Instructions (Signed)
Recommend the book "The END of DIETING" by Dr Baker Janus   and the book "The END of DIABETES " by Dr Excell Seltzer  At [email or web address] - get book & Audio CD's      Being diabetic has a  300% increased risk for heart attack, stroke, cancer, and alzheimer- type vascular dementia. It is very important that you work harder with diet by avoiding all foods that are white except chicken & fish. Avoid white rice (brown & wild rice is OK), white potatoes (sweetpotatoes in moderation is OK), White bread or wheat bread or anything made out of white flour like bagels, donuts, rolls, buns, biscuits, cakes, pastries, cookies, pizza crust, and pasta (made from white flour & egg whites) - vegetarian pasta or spinach or wheat pasta is OK. Multigrain breads like Arnold's or Pepper[employer], or multigrain sandwich thins or flatbreads.  Diet, exercise and weight loss can reverse and cure diabetes in the early stages.  Diet, exercise and weight loss is very important in the control and prevention of complications of diabetes which affects every system in your body, ie. Brain - dementia/stroke, eyes - glaucoma/blindness, heart - heart attack/heart failure, kidneys - dialysis, stomach - gastric paralysis, intestines - malabsorption, nerves - severe painful neuritis, circulation - gangrene & loss of a leg(s), and finally cancer and Alzheimers.    I recommend avoid fried & greasy foods,  sweets/candy, white rice (brown or wild rice or Quinoa is OK), white potatoes (sweet potatoes are OK) - anything made from white flour - bagels, doughnuts, rolls, buns, biscuits,white and wheat breads, pizza crust and traditional pasta made of white flour & egg white(vegetarian pasta or spinach or wheat pasta is OK).  Multi-grain bread is OK - like multi-grain flat bread or sandwich thins. Avoid alcohol in excess. Exercise is also important.    Eat all the vegetables you want - avoid meat, especially red meat and dairy - especially cheese.  Cheese  is the most concentrated form of trans-fats which is the worst thing to clog up our arteries. Veggie cheese is OK which can be found in the fresh produce section at [employer] or [employer] or [employer]  Preventive Care for Adults A healthy lifestyle and preventive care can promote health and wellness. Preventive health guidelines for women include the following key practices.  A routine yearly physical is a good way to check with your health care provider about your health and preventive screening. It is a chance to share any concerns and updates on your health and to receive a thorough exam.  Visit your dentist for a routine exam and preventive care every 6 months. Brush your teeth twice a day and floss once a day. Good oral hygiene prevents tooth decay and gum disease.  The frequency of eye exams is based on your age, health, family medical history, use of contact lenses, and other factors. Follow your health care provider's recommendations for frequency of eye exams.  Eat a healthy diet. Foods like vegetables, fruits, whole grains, low-fat dairy products, and lean protein foods contain the nutrients you need without too many calories. Decrease your intake of foods high in solid fats, added sugars, and salt. Eat the right amount of calories for you.Get information about a proper diet from your health care provider, if necessary.  Regular physical exercise is one of the most important things you can do for your health. Most adults should get at least 150 minutes of moderate-intensity exercise (any activity that increases  your heart rate and causes you to sweat) each week. In addition, most adults need muscle-strengthening exercises on 2 or more days a week.  Maintain a healthy weight. The body mass index (BMI) is a screening tool to identify possible weight problems. It provides an estimate of body fat based on height and weight. Your health care provider can find your BMI and can help you  achieve or maintain a healthy weight.For adults 20 years and older:  A BMI below 18.5 is considered underweight.  A BMI of 18.5 to 24.9 is normal.  A BMI of 25 to 29.9 is considered overweight.  A BMI of 30 and above is considered obese.  Maintain normal blood lipids and cholesterol levels by exercising and minimizing your intake of saturated fat. Eat a balanced diet with plenty of fruit and vegetables. If your lipid or cholesterol levels are high, you are over 50, or you are at high risk for heart disease, you may need your cholesterol levels checked more frequently.Ongoing high lipid and cholesterol levels should be treated with medicines if diet and exercise are not working.  If you smoke, find out from your health care provider how to quit. If you do not use tobacco, do not start.  Lung cancer screening is recommended for adults aged 55-80 years who are at high risk for developing lung cancer because of a history of smoking. A yearly low-dose CT scan of the lungs is recommended for people who have at least a 30-pack-year history of smoking and are a current smoker or have quit within the past 15 years. A pack year of smoking is smoking an average of 1 pack of cigarettes a day for 1 year (for example: 1 pack a day for 30 years or 2 packs a day for 15 years). Yearly screening should continue until the smoker has stopped smoking for at least 15 years. Yearly screening should be stopped for people who develop a health problem that would prevent them from having lung cancer treatment.  Avoid use of street drugs. Do not share needles with anyone. Ask for help if you need support or instructions about stopping the use of drugs.  High blood pressure causes heart disease and increases the risk of stroke.  Ongoing high blood pressure should be treated with medicines if weight loss and exercise do not work.  If you are 55-79 years old, ask your health care provider if you should take aspirin to  prevent strokes.  Diabetes screening involves taking a blood sample to check your fasting blood sugar level. This should be done once every 3 years, after age 45, if you are within normal weight and without risk factors for diabetes. Testing should be considered at a younger age or be carried out more frequently if you are overweight and have at least 1 risk factor for diabetes.  Breast cancer screening is essential preventive care for women. You should practice "breast self-awareness." This means understanding the normal appearance and feel of your breasts and may include breast self-examination. Any changes detected, no matter how small, should be reported to a health care provider. Women in their 20s and 30s should have a clinical breast exam (CBE) by a health care provider as part of a regular health exam every 1 to 3 years. After age 40, women should have a CBE every year. Starting at age 40, women should consider having a mammogram (breast X-ray test) every year. Women who have a family history of breast cancer should   talk to their health care provider about genetic screening. Women at a high risk of breast cancer should talk to their health care providers about having an MRI and a mammogram every year.  Breast cancer gene (BRCA)-related cancer risk assessment is recommended for women who have family members with BRCA-related cancers. BRCA-related cancers include breast, ovarian, tubal, and peritoneal cancers. Having family members with these cancers may be associated with an increased risk for harmful changes (mutations) in the breast cancer genes BRCA1 and BRCA2. Results of the assessment will determine the need for genetic counseling and BRCA1 and BRCA2 testing.  Routine pelvic exams to screen for cancer are no longer recommended for nonpregnant women who are considered low risk for cancer of the pelvic organs (ovaries, uterus, and vagina) and who do not have symptoms. Ask your health care provider  if a screening pelvic exam is right for you.  If you have had past treatment for cervical cancer or a condition that could lead to cancer, you need Pap tests and screening for cancer for at least 20 years after your treatment. If Pap tests have been discontinued, your risk factors (such as having a new sexual partner) need to be reassessed to determine if screening should be resumed. Some women have medical problems that increase the chance of getting cervical cancer. In these cases, your health care provider may recommend more frequent screening and Pap tests.    Colorectal cancer can be detected and often prevented. Most routine colorectal cancer screening begins at the age of 90 years and continues through age 8 years. However, your health care provider may recommend screening at an earlier age if you have risk factors for colon cancer. On a yearly basis, your health care provider may provide home test kits to check for hidden blood in the stool. Use of a small camera at the end of a tube, to directly examine the colon (sigmoidoscopy or colonoscopy), can detect the earliest forms of colorectal cancer. Talk to your health care provider about this at age 86, when routine screening begins. Direct exam of the colon should be repeated every 5-10 years through age 32 years, unless early forms of pre-cancerous polyps or small growths are found.  Osteoporosis is a disease in which the bones lose minerals and strength with aging. This can result in serious bone fractures or breaks. The risk of osteoporosis can be identified using a bone density scan. Women ages 75 years and over and women at risk for fractures or osteoporosis should discuss screening with their health care providers. Ask your health care provider whether you should take a calcium supplement or vitamin D to reduce the rate of osteoporosis.  Menopause can be associated with physical symptoms and risks. Hormone replacement therapy is available to  decrease symptoms and risks. You should talk to your health care provider about whether hormone replacement therapy is right for you.  Use sunscreen. Apply sunscreen liberally and repeatedly throughout the day. You should seek shade when your shadow is shorter than you. Protect yourself by wearing long sleeves, pants, a wide-brimmed hat, and sunglasses year round, whenever you are outdoors.  Once a month, do a whole body skin exam, using a mirror to look at the skin on your back. Tell your health care provider of new moles, moles that have irregular borders, moles that are larger than a pencil eraser, or moles that have changed in shape or color.  Stay current with required vaccines (immunizations).  Influenza vaccine. All adults  should be immunized every year.  Tetanus, diphtheria, and acellular pertussis (Td, Tdap) vaccine. Pregnant women should receive 1 dose of Tdap vaccine during each pregnancy. The dose should be obtained regardless of the length of time since the last dose. Immunization is preferred during the 27th-36th week of gestation. An adult who has not previously received Tdap or who does not know her vaccine status should receive 1 dose of Tdap. This initial dose should be followed by tetanus and diphtheria toxoids (Td) booster doses every 10 years. Adults with an unknown or incomplete history of completing a 3-dose immunization series with Td-containing vaccines should begin or complete a primary immunization series including a Tdap dose. Adults should receive a Td booster every 10 years.    Zoster vaccine. One dose is recommended for adults aged 44 years or older unless certain conditions are present.    Pneumococcal 13-valent conjugate (PCV13) vaccine. When indicated, a person who is uncertain of her immunization history and has no record of immunization should receive the PCV13 vaccine. An adult aged 74 years or older who has certain medical conditions and has not been previously  immunized should receive 1 dose of PCV13 vaccine. This PCV13 should be followed with a dose of pneumococcal polysaccharide (PPSV23) vaccine. The PPSV23 vaccine dose should be obtained at least 8 weeks after the dose of PCV13 vaccine. An adult aged 66 years or older who has certain medical conditions and previously received 1 or more doses of PPSV23 vaccine should receive 1 dose of PCV13. The PCV13 vaccine dose should be obtained 1 or more years after the last PPSV23 vaccine dose.    Pneumococcal polysaccharide (PPSV23) vaccine. When PCV13 is also indicated, PCV13 should be obtained first. All adults aged 89 years and older should be immunized. An adult younger than age 80 years who has certain medical conditions should be immunized. Any person who resides in a nursing home or long-term care facility should be immunized. An adult smoker should be immunized. People with an immunocompromised condition and certain other conditions should receive both PCV13 and PPSV23 vaccines. People with human immunodeficiency virus (HIV) infection should be immunized as soon as possible after diagnosis. Immunization during chemotherapy or radiation therapy should be avoided. Routine use of PPSV23 vaccine is not recommended for American Indians, [place] Natives, or people younger than 65 years unless there are medical conditions that require PPSV23 vaccine. When indicated, people who have unknown immunization and have no record of immunization should receive PPSV23 vaccine. One-time revaccination 5 years after the first dose of PPSV23 is recommended for people aged 19-64 years who have chronic kidney failure, nephrotic syndrome, asplenia, or immunocompromised conditions. People who received 1-2 doses of PPSV23 before age 34 years should receive another dose of PPSV23 vaccine at age 31 years or later if at least 5 years have passed since the previous dose. Doses of PPSV23 are not needed for people immunized with PPSV23 at or after  age 29 years.   Preventive Services / Frequency  Ages 55 years and over  Blood pressure check.  Lipid and cholesterol check.  Lung cancer screening. / Every year if you are aged 29-80 years and have a 30-pack-year history of smoking and currently smoke or have quit within the past 15 years. Yearly screening is stopped once you have quit smoking for at least 15 years or develop a health problem that would prevent you from having lung cancer treatment.  Clinical breast exam.** / Every year after age 40 years.  BRCA-related cancer risk assessment.** / For women who have family members with a BRCA-related cancer (breast, ovarian, tubal, or peritoneal cancers).  Mammogram.** / Every year beginning at age 40 years and continuing for as long as you are in good health. Consult with your health care provider.  Pap test.** / Every 3 years starting at age 30 years through age 65 or 70 years with 3 consecutive normal Pap tests. Testing can be stopped between 65 and 70 years with 3 consecutive normal Pap tests and no abnormal Pap or HPV tests in the past 10 years.  Fecal occult blood test (FOBT) of stool. / Every year beginning at age 50 years and continuing until age 75 years. You may not need to do this test if you get a colonoscopy every 10 years.  Flexible sigmoidoscopy or colonoscopy.** / Every 5 years for a flexible sigmoidoscopy or every 10 years for a colonoscopy beginning at age 50 years and continuing until age 75 years.  Hepatitis C blood test.** / For all people born from 1945 through 1965 and any individual with known risks for hepatitis C.  Osteoporosis screening.** / A one-time screening for women ages 65 years and over and women at risk for fractures or osteoporosis.  Skin self-exam. / Monthly.  Influenza vaccine. / Every year.  Tetanus, diphtheria, and acellular pertussis (Tdap/Td) vaccine.** / 1 dose of Td every 10 years.  Zoster vaccine.** / 1 dose for adults aged 60 years  or older.  Pneumococcal 13-valent conjugate (PCV13) vaccine.** / Consult your health care provider.  Pneumococcal polysaccharide (PPSV23) vaccine.** / 1 dose for all adults aged 65 years and older. Screening for abdominal aortic aneurysm (AAA)  by ultrasound is recommended for people who have history of high blood pressure or who are current or former smokers. 

## 2014-01-10 LAB — HEPATIC FUNCTION PANEL
ALT: <8 U/L (ref 0–35)
AST: 14 U/L (ref 0–37)
Albumin: 3.9 g/dL (ref 3.5–5.2)
Alkaline Phosphatase: 61 U/L (ref 39–117)
BILIRUBIN DIRECT: 0.1 mg/dL (ref 0.0–0.3)
Indirect Bilirubin: 0.2 mg/dL (ref 0.2–1.2)
TOTAL PROTEIN: 6.4 g/dL (ref 6.0–8.3)
Total Bilirubin: 0.3 mg/dL (ref 0.2–1.2)

## 2014-01-10 LAB — HEMOGLOBIN A1C
HEMOGLOBIN A1C: 5.4 % (ref <5.7)
MEAN PLASMA GLUCOSE: 108 mg/dL (ref <117)

## 2014-01-10 LAB — MAGNESIUM: MAGNESIUM: 1.9 mg/dL (ref 1.5–2.5)

## 2014-01-10 LAB — VITAMIN D 25 HYDROXY (VIT D DEFICIENCY, FRACTURES): VIT D 25 HYDROXY: 23 ng/mL — AB (ref 30–100)

## 2014-01-10 LAB — LIPID PANEL
Cholesterol: 180 mg/dL (ref 0–200)
HDL: 34 mg/dL — ABNORMAL LOW (ref >39)
LDL Cholesterol: 108 mg/dL — ABNORMAL HIGH (ref 0–99)
Total CHOL/HDL Ratio: 5.3 Ratio
Triglycerides: 192 mg/dL — ABNORMAL HIGH (ref <150)
VLDL: 38 mg/dL (ref 0–40)

## 2014-01-10 LAB — BASIC METABOLIC PANEL WITH GFR
BUN: 25 mg/dL — ABNORMAL HIGH (ref 6–23)
CALCIUM: 9.3 mg/dL (ref 8.4–10.5)
CO2: 26 mEq/L (ref 19–32)
Chloride: 105 mEq/L (ref 96–112)
Creat: 1.54 mg/dL — ABNORMAL HIGH (ref 0.50–1.10)
GFR, EST AFRICAN AMERICAN: 38 mL/min — AB
GFR, Est Non African American: 33 mL/min — ABNORMAL LOW
GLUCOSE: 83 mg/dL (ref 70–99)
Potassium: 4.6 mEq/L (ref 3.5–5.3)
SODIUM: 137 meq/L (ref 135–145)

## 2014-01-10 LAB — INSULIN, FASTING: Insulin fasting, serum: 4.8 u[IU]/mL (ref 2.0–19.6)

## 2014-01-10 LAB — TSH: TSH: 1.548 u[IU]/mL (ref 0.350–4.500)

## 2014-01-15 ENCOUNTER — Inpatient Hospital Stay: Admission: RE | Admit: 2014-01-15 | Payer: Medicare Other | Source: Ambulatory Visit

## 2014-01-21 ENCOUNTER — Other Ambulatory Visit: Payer: Self-pay | Admitting: Physician Assistant

## 2014-02-05 ENCOUNTER — Other Ambulatory Visit: Payer: Self-pay | Admitting: Internal Medicine

## 2014-02-05 MED ORDER — DIAZEPAM 10 MG PO TABS: 10.0000 mg | ORAL_TABLET | Freq: Three times a day (TID) | ORAL | Status: DC | PRN

## 2014-02-24 ENCOUNTER — Other Ambulatory Visit: Payer: Self-pay | Admitting: Physician Assistant

## 2014-03-19 ENCOUNTER — Other Ambulatory Visit: Payer: Self-pay | Admitting: Internal Medicine

## 2014-03-22 ENCOUNTER — Emergency Department (HOSPITAL_COMMUNITY): Payer: Commercial Managed Care - HMO

## 2014-03-22 ENCOUNTER — Encounter (HOSPITAL_COMMUNITY): Payer: Self-pay | Admitting: Emergency Medicine

## 2014-03-22 ENCOUNTER — Emergency Department (HOSPITAL_COMMUNITY)
Admission: EM | Admit: 2014-03-22 | Discharge: 2014-03-22 | Disposition: A | Discharge status: Home / Self Care | Payer: Commercial Managed Care - HMO | Attending: Emergency Medicine | Admitting: Emergency Medicine

## 2014-03-22 DIAGNOSIS — Z8719 Personal history of other diseases of the digestive system: Secondary | ICD-10-CM | POA: Insufficient documentation

## 2014-03-22 DIAGNOSIS — N390 Urinary tract infection, site not specified: Secondary | ICD-10-CM | POA: Diagnosis not present

## 2014-03-22 DIAGNOSIS — Z720 Tobacco use: Secondary | ICD-10-CM | POA: Diagnosis not present

## 2014-03-22 DIAGNOSIS — Z8673 Personal history of transient ischemic attack (TIA), and cerebral infarction without residual deficits: Secondary | ICD-10-CM | POA: Diagnosis not present

## 2014-03-22 DIAGNOSIS — J449 Chronic obstructive pulmonary disease, unspecified: Secondary | ICD-10-CM | POA: Diagnosis not present

## 2014-03-22 DIAGNOSIS — I10 Essential (primary) hypertension: Secondary | ICD-10-CM | POA: Insufficient documentation

## 2014-03-22 DIAGNOSIS — M1990 Unspecified osteoarthritis, unspecified site: Secondary | ICD-10-CM | POA: Insufficient documentation

## 2014-03-22 DIAGNOSIS — I714 Abdominal aortic aneurysm, without rupture, unspecified: Secondary | ICD-10-CM

## 2014-03-22 DIAGNOSIS — Z862 Personal history of diseases of the blood and blood-forming organs and certain disorders involving the immune mechanism: Secondary | ICD-10-CM | POA: Diagnosis not present

## 2014-03-22 DIAGNOSIS — Z8639 Personal history of other endocrine, nutritional and metabolic disease: Secondary | ICD-10-CM | POA: Insufficient documentation

## 2014-03-22 DIAGNOSIS — I712 Thoracic aortic aneurysm, without rupture, unspecified: Secondary | ICD-10-CM

## 2014-03-22 DIAGNOSIS — Z7982 Long term (current) use of aspirin: Secondary | ICD-10-CM | POA: Diagnosis not present

## 2014-03-22 DIAGNOSIS — Z79899 Other long term (current) drug therapy: Secondary | ICD-10-CM | POA: Insufficient documentation

## 2014-03-22 DIAGNOSIS — R531 Weakness: Secondary | ICD-10-CM

## 2014-03-22 DIAGNOSIS — F329 Major depressive disorder, single episode, unspecified: Secondary | ICD-10-CM | POA: Diagnosis not present

## 2014-03-22 LAB — URINALYSIS, ROUTINE W REFLEX MICROSCOPIC
GLUCOSE, UA: NEGATIVE mg/dL
Ketones, ur: 40 mg/dL — AB
Nitrite: POSITIVE — AB
Protein, ur: 100 mg/dL — AB
Specific Gravity, Urine: 1.038 — ABNORMAL HIGH (ref 1.005–1.030)
Urobilinogen, UA: 1 mg/dL (ref 0.0–1.0)
pH: 7 (ref 5.0–8.0)

## 2014-03-22 LAB — CBC WITH DIFFERENTIAL/PLATELET
BASOS ABS: 0 10*3/uL (ref 0.0–0.1)
Basophils Relative: 0 % (ref 0–1)
EOS ABS: 0.1 10*3/uL (ref 0.0–0.7)
Eosinophils Relative: 1 % (ref 0–5)
HCT: 42.1 % (ref 36.0–46.0)
HEMOGLOBIN: 14.2 g/dL (ref 12.0–15.0)
Lymphocytes Relative: 12 % (ref 12–46)
Lymphs Abs: 1.3 10*3/uL (ref 0.7–4.0)
MCH: 31.1 pg (ref 26.0–34.0)
MCHC: 33.7 g/dL (ref 30.0–36.0)
MCV: 92.1 fL (ref 78.0–100.0)
MONO ABS: 1.2 10*3/uL — AB (ref 0.1–1.0)
MONOS PCT: 12 % (ref 3–12)
Neutro Abs: 7.8 10*3/uL — ABNORMAL HIGH (ref 1.7–7.7)
Neutrophils Relative %: 75 % (ref 43–77)
Platelets: 162 10*3/uL (ref 150–400)
RBC: 4.57 MIL/uL (ref 3.87–5.11)
RDW: 13.1 % (ref 11.5–15.5)
WBC: 10.4 10*3/uL (ref 4.0–10.5)

## 2014-03-22 LAB — COMPREHENSIVE METABOLIC PANEL
ALBUMIN: 3.8 g/dL (ref 3.5–5.2)
ALT: 9 U/L (ref 0–35)
AST: 19 U/L (ref 0–37)
Alkaline Phosphatase: 72 U/L (ref 39–117)
Anion gap: 8 (ref 5–15)
BUN: 20 mg/dL (ref 6–23)
CALCIUM: 9.3 mg/dL (ref 8.4–10.5)
CHLORIDE: 101 mmol/L (ref 96–112)
CO2: 29 mmol/L (ref 19–32)
Creatinine, Ser: 1.66 mg/dL — ABNORMAL HIGH (ref 0.50–1.10)
GFR calc Af Amer: 34 mL/min — ABNORMAL LOW (ref >90)
GFR calc non Af Amer: 29 mL/min — ABNORMAL LOW (ref >90)
GLUCOSE: 122 mg/dL — AB (ref 70–99)
Potassium: 3.6 mmol/L (ref 3.5–5.1)
Sodium: 138 mmol/L (ref 135–145)
TOTAL PROTEIN: 7 g/dL (ref 6.0–8.3)
Total Bilirubin: 1.3 mg/dL — ABNORMAL HIGH (ref 0.3–1.2)

## 2014-03-22 LAB — URINE MICROSCOPIC-ADD ON

## 2014-03-22 LAB — I-STAT TROPONIN, ED: Troponin i, poc: 0 ng/mL (ref 0.00–0.08)

## 2014-03-22 MED ORDER — DOCUSATE SODIUM 100 MG PO CAPS: 100.0000 mg | ORAL_CAPSULE | Freq: Two times a day (BID) | ORAL | Status: DC

## 2014-03-22 MED ORDER — CEPHALEXIN 500 MG PO CAPS: 500.0000 mg | ORAL_CAPSULE | Freq: Four times a day (QID) | ORAL | Status: DC

## 2014-03-22 MED ORDER — POLYETHYLENE GLYCOL 3350 17 GM/SCOOP PO POWD: 17.0000 g | Freq: Two times a day (BID) | ORAL | Status: DC

## 2014-03-22 MED ORDER — ACETAMINOPHEN 325 MG PO TABS
650.0000 mg | ORAL_TABLET | Freq: Once | ORAL | Status: AC
  Administered 2014-03-22: 650 mg via ORAL
  Filled 2014-03-22: qty 2

## 2014-03-22 MED ORDER — IOHEXOL 350 MG/ML SOLN
100.0000 mL | Freq: Once | INTRAVENOUS | Status: AC | PRN
Start: 2014-03-22 — End: 2014-03-22
  Administered 2014-03-22: 100 mL via INTRAVENOUS

## 2014-03-22 NOTE — ED Notes (Signed)
Patient transported to X-ray 

## 2014-03-22 NOTE — ED Provider Notes (Signed)
CSN: [id number]     Arrival date & time 03/22/14  1311 History   First MD Initiated Contact with Patient 03/22/14 1321     Chief Complaint  Patient presents with  . Weakness     (Consider location/radiation/quality/duration/timing/severity/associated sxs/prior Treatment) HPI  Amy Moss is a 74 y.o. female with PMH of hypertension, CVA, AAA, COPD presenting with generalized and progressively gradually worsening weakness. Patient reports feeling lightheaded and falling to her knees twice in the past to 3 days. She denies any head injury, loss of consciousness, numbness, tingling, focal weakness. No visual changes or slurred speech. She denies any chest pain, shortness of breath. No nausea vomiting or abdominal pain. No fevers or chills. Patient was at home with care from her daughter. She states she has been using a walker. No anticoagulant use. No hematochezia or melanotic stool. At baseline patient incontinence of urine and stool.    Past Medical History  Diagnosis Date  . Hypertension   . Stroke   . Hypercholesterolemia   . AAA (abdominal aortic aneurysm) without rupture   . Anemia   . Diverticulosis   . Hemorrhoids   . Lower GI bleed   . Depression   . Brain stem stroke syndrome   . DJD (degenerative joint disease)   . Fatty liver 08/30/09  . COPD (chronic obstructive pulmonary disease)    Past Surgical History  Procedure Laterality Date  . Laminectomy      x 4  . Knee surgery    . Abdominal hysterectomy    . Eye surgery    . Cataract extraction, bilateral    . Shoulder surgery     Family History  Problem Relation Age of Onset  . Heart disease Sister   . Heart disease Brother   . Cancer Sister   . Alzheimer's disease Mother   . CVA Father    History  Substance Use Topics  . Smoking status: Current Some Day Smoker    Types: Cigarettes  . Smokeless tobacco: Never Used     Comment: patient states she does not smoke daily, only when she is upset.  . Alcohol Use:  No   OB History    No data available     Review of Systems 10 Systems reviewed and are negative for acute change except as noted in the HPI.    Allergies  Review of patient's allergies indicates no known allergies.  Home Medications   Prior to Admission medications   Medication Sig Start Date End Date Taking? Authorizing Provider  aspirin 325 MG tablet Take 325 mg by mouth.   Yes Historical Provider, MD  citalopram (CELEXA) 20 MG tablet TAKE 1 TABLET BY MOUTH EVERY DAY FOR MOOD AND PAIN 02/05/14  Yes Lucky Cowboy, MD  diazepam (VALIUM) 10 MG tablet TAKE 1 TABLET BY MOUTH 3 TIMES A DAY AS NEEDED 03/19/14  Yes Quentin Mulling, PA-C  enalapril (VASOTEC) 20 MG tablet Take 20 mg by mouth. 06/16/12  Yes Historical Provider, MD  ibuprofen (ADVIL,MOTRIN) 200 MG tablet Take 400-800 mg by mouth every 6 (six) hours as needed for moderate pain.   Yes Historical Provider, MD  metoprolol (LOPRESSOR) 50 MG tablet TAKE 1 TABLET BY MOUTH TWICE A DAY FOR BLOOD PRESSURE 11/15/13  Yes Lucky Cowboy, MD  minoxidil (LONITEN) 2.5 MG tablet TAKE 1 TABLET BY MOUTH TWICE A DAY 01/21/14  Yes Lucky Cowboy, MD  traMADol (ULTRAM) 50 MG tablet Take 1 tablet 4 x day only if needed for  severe pain 02/24/14  Yes Lucky CowboyWilliam McKeown, MD  cephALEXin (KEFLEX) 500 MG capsule Take 1 capsule (500 mg total) by mouth 4 (four) times daily. 03/22/14   Earle GellBenjamin W Cartner, PA-C  docusate sodium (COLACE) 100 MG capsule Take 1 capsule (100 mg total) by mouth every 12 (twelve) hours. 03/22/14   Earle GellBenjamin W Cartner, PA-C  polyethylene glycol powder (GLYCOLAX/MIRALAX) powder Take 17 g by mouth 2 (two) times daily. Until daily soft stools  OTC 03/22/14   Earle GellBenjamin W Cartner, PA-C   BP 149/97 mmHg  Pulse 90  Temp(Src) 98.5 F (36.9 C) (Oral)  Resp 18  SpO2 95% Physical Exam  Constitutional: She appears well-developed and well-nourished. No distress.  HENT:  Head: Normocephalic and atraumatic.  Dry mucous membranes  Eyes: Conjunctivae and  EOM are normal. Pupils are equal, round, and reactive to light. Right eye exhibits no discharge. Left eye exhibits no discharge.  Cardiovascular: Normal rate and regular rhythm.   Pulmonary/Chest: Effort normal and breath sounds normal. No respiratory distress. She has no wheezes.  Abdominal: Soft. Bowel sounds are normal. She exhibits no distension.  Pulsatile abdominal aorta with tenderness. No rebound, rigidity, guarding.  Neurological: She is alert. She exhibits normal muscle tone. Coordination normal.  Patient oriented to person here in place. Speech is clear and goal oriented. Peripheral visual fields intact. Strength 5/5 in upper and lower extremities. Sensation intact. Intact rapid alternating movements, finger to nose, and heel to shin. No pronator drift.   Skin: Skin is warm and dry. She is not diaphoretic.  Nursing note and vitals reviewed.   ED Course  Procedures (including critical care time) Labs Review Labs Reviewed  URINALYSIS, ROUTINE W REFLEX MICROSCOPIC - Abnormal; Notable for the following:    Color, Urine AMBER (*)    APPearance CLOUDY (*)    Specific Gravity, Urine 1.038 (*)    Hgb urine dipstick MODERATE (*)    Bilirubin Urine SMALL (*)    Ketones, ur 40 (*)    Protein, ur 100 (*)    Nitrite POSITIVE (*)    Leukocytes, UA SMALL (*)    All other components within normal limits  CBC WITH DIFFERENTIAL/PLATELET - Abnormal; Notable for the following:    Neutro Abs 7.8 (*)    Monocytes Absolute 1.2 (*)    All other components within normal limits  COMPREHENSIVE METABOLIC PANEL - Abnormal; Notable for the following:    Glucose, Bld 122 (*)    Creatinine, Ser 1.66 (*)    Total Bilirubin 1.3 (*)    GFR calc non Af Amer 29 (*)    GFR calc Af Amer 34 (*)    All other components within normal limits  URINE MICROSCOPIC-ADD ON - Abnormal; Notable for the following:    Bacteria, UA MANY (*)    All other components within normal limits  URINE CULTURE  I-STAT TROPOININ,  ED    Imaging Review Ct Head Wo Contrast  03/22/2014   CLINICAL DATA:  Generalized weakness for weak. Altered mental status.  EXAM: CT HEAD WITHOUT CONTRAST  TECHNIQUE: Contiguous axial images were obtained from the base of the skull through the vertex without intravenous contrast.  COMPARISON:  CT scan of January 11, 2012.  FINDINGS: Bony calvarium appears intact. Stable mild diffuse cortical atrophy is noted. Mild chronic ischemic white matter disease is noted. Stable old right cerebellar infarction. No mass effect or midline shift is noted. Ventricular size is within normal limits. There is no evidence of mass lesion, hemorrhage  or acute infarction.  IMPRESSION: Stable diffuse cortical atrophy and chronic ischemic white matter disease. No acute intracranial abnormality seen.   Electronically Signed   By: Lupita Raider, M.D.   On: 03/22/2014 17:00   Dg Knee Complete 4 Views Left  03/22/2014   CLINICAL DATA:  Recent fall on both views with pain  EXAM: LEFT KNEE - COMPLETE 4+ VIEW  COMPARISON:  None.  FINDINGS: Degenerative changes are noted in all 3 joint compartments. Diffuse calcification of the menisci is identified consistent with deposition disease. No significant joint effusion is noted. No acute fracture or dislocation is seen.  IMPRESSION: Chronic changes without acute abnormality.   Electronically Signed   By: Alcide Clever M.D.   On: 03/22/2014 15:19   Dg Knee Complete 4 Views Right  03/22/2014   CLINICAL DATA:  Fall with bilateral knee pain  EXAM: RIGHT KNEE - COMPLETE 4+ VIEW  COMPARISON:  None.  FINDINGS: Degenerative changes are noted in all 3 joint compartments. No acute fracture or dislocation is seen. Diffuse meniscal calcification is noted consistent with deposition disease. No soft tissue abnormality is noted.  IMPRESSION: Degenerative change without acute abnormality.   Electronically Signed   By: Alcide Clever M.D.   On: 03/22/2014 15:21   Ct Angio Abd/pel W/ And/or W/o  03/22/2014    CLINICAL DATA:  Weakness and fall. Mental status changes. History of aortic aneurysm.  EXAM: CTA ABDOMEN AND PELVIS WITHOUT CONTRAST  TECHNIQUE: Multidetector CT imaging of the abdomen and pelvis was performed using the standard protocol during bolus administration of intravenous contrast. Multiplanar reconstructed images and MIPs were obtained and reviewed to evaluate the vascular anatomy.  CONTRAST:  OMNIPAQUE IOHEXOL 350 MG/ML SOLN  COMPARISON:  Runoff CTA study on 11/13/2011  FINDINGS: There is enlargement of a thoracoabdominal aortic aneurysm since prior imaging. The lower descending thoracic component of aneurysm measures as much as 6.8 cm in transverse diameter. Just above the diaphragmatic hiatus, 2 separate areas of penetrating aortic ulcers are identified with the larger measuring approximately 2.5 cm and the smaller measuring approximately 1 cm. No associated aortic dissection.  Abdominal extension of aneurysmal disease measures 4.5 x 5.2 cm just below the level of the right renal artery. This represents enlargement from a maximum diameter of approximately 4.3 cm on the prior study. Due to slow flow and early scan time relative to aortic opacification, the distal aorta and iliac arteries are not optimally opacified. There is no evidence of rupture of the aortic aneurysm or evidence of inflammatory aneurysm. 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 The left renal artery is likely chronically occluded with evidence of a severely atrophic left kidney. This is a stable appearance. The inferior mesenteric artery is occluded.  The gallbladder is mildly distended without significant surrounding inflammation. No abnormal fluid collections are seen. There is a large amount of fecal material in the rectum consistent with rectal fecal impaction. The rectum measures up to 9.8 cm in  diameter. No evidence of free air or small bowel obstruction. No hernias are identified. No focal mass lesions or enlarged lymph nodes are seen. Bony structures show osteopenia degenerative disease of the lumbar spine.  Review of the MIP images confirms the above findings.  IMPRESSION: 1. Significant enlargement of thoracoabdominal aortic aneurysm since 2013. Maximum diameter of the lower thoracic aneurysmal aorta is now 6.8 cm compared to 4.8  cm previously. The entire thoracic aorta was not visualized. 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 The abdominal aorta also demonstrates enlargement with maximum diameter of 5.2 cm compared to 4.3 cm previously. There is no evidence of aortic rupture or dissection. 2. 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 3. 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 4. Rectal fecal impaction with significant distension of the rectum by stool.   Electronically Signed   By: Irish Lack M.D.   On: 03/22/2014 17:14     EKG Interpretation None       Date: 03/22/2014  Rate: 96  Rhythm: normal sinus rhythm  QRS Axis: normal  Intervals: normal  ST/T Wave abnormalities: normal  Conduction Disutrbances:premature atrial complexes  Narrative Interpretation: borderline repolarization abnormality     MDM   Final diagnoses:  UTI (lower urinary tract infection)  Weakness  Thoracic aortic aneurysm without rupture   Pt presenting with gradual worsening weakness for one week and frequent falls. No syncope, head injury. Daughter denies abrupt confusion but gradual decline. VSS. Pt with normal neurological exam, steady assisted gait.tenderness to generalized abdominal tenderness worse at abdominal aorta. EKG without acute abnormalities. Negative troponin. Lab work largely unremarkable with values at  baseline. CT abdomen ordered to rule out AAA rupture and CT head to evaluate for intracranial abnormalities. UA pending. Pt non toxic and in no acute distress.  Pt signed out to [employer] at shift change Plan: CT abdomen pelvis and head and UA pending. Dispo accordingly.   This is a shared patient. This patient was discussed with the physician who saw and evaluated the patient.      Louann Sjogren, PA-C 03/22/14 2053  Louann Sjogren, PA-C 03/22/14 2054  Benny Lennert, MD 03/23/14 [phone]

## 2014-03-22 NOTE — ED Notes (Signed)
In and Out Cath performed by Tula NakayamaSteven RN, witnessed by EMT Apolinar JunesBrandon.

## 2014-03-22 NOTE — ED Notes (Signed)
Patient transported to CT 

## 2014-03-22 NOTE — ED Notes (Signed)
Per EMS pt c/o weakness for the past week. Pt sts she has been falling a lot the past week on her knees.L knee noted to have swelling. Pt has a black/grey discoloring on chest/arms. Pt sts she has had new flannel pajamas that may have caused the discoloration. Pt family sts pt has AMS for the past 4 days. Pt noted to be incontinence of urine.

## 2014-03-22 NOTE — ED Provider Notes (Signed)
Care signed out to me by Oswaldo ConroyVictoria Creech, PA-C. Patient is here for generalized weakness for the past 4 days with multiple falls forward onto her knees. Patient has known large vessel disease that [facility]. Patient is aware of her worsening condition and does not wish to have any interventional surgeries at this time. Discussed results and imaging from today's ED evaluation, and patient is adamant she does not want any interventions done. Her daughter, Misty StanleyLisa, is present with her and agrees with this plan. Patient appears to be of sound mind and capable of making this decision. Patient lives at home with her other grand daughter and has close follow-up with her PCP, Dr. Oneta RackMcKeown. Evidence of fecal impaction on abdominal CT. Attempted digital disimpaction at bedside, however only soft brown stool in rectal vault. HEad CT negative for acute changes. Xrays of knees negative for frx or dislocation. Evidence of UTI on urinalysis. Will treat empirically and obtain urine culture. Labs are otherwise noncontributory. Vitals stable, afebrile. Filed Vitals:   03/22/14 1415 03/22/14 1600 03/22/14 1615 03/22/14 1657  BP: 162/87 165/102 160/87 178/97  Pulse: 93 97 96 94  Temp:      TempSrc:      Resp: 28 29 31 14   SpO2: 96% 97% 97% 98%   Meds given in ED:  Medications  acetaminophen (TYLENOL) tablet 650 mg (650 mg Oral Given 03/22/14 1544)  iohexol (OMNIPAQUE) 350 MG/ML injection 100 mL (100 mLs Intravenous Contrast Given 03/22/14 1633)    Discharge Medication List as of 03/22/2014  6:10 PM    START taking these medications   Details  cephALEXin (KEFLEX) 500 MG capsule Take 1 capsule (500 mg total) by mouth 4 (four) times daily., Starting 03/22/2014, Until Discontinued, Print    docusate sodium (COLACE) 100 MG capsule Take 1 capsule (100 mg total) by mouth every 12 (twelve) hours., Starting 03/22/2014, Until Discontinued, Print    polyethylene glycol powder (GLYCOLAX/MIRALAX) powder Take 17 g  by mouth 2 (two) times daily. Until daily soft stools  OTC, Starting 03/22/2014, Until Discontinued, Print        Prior to patient discharge, I discussed and reviewed this case with Dr. Shelda AltesPlunkett     Tennelle Taflinger W Aryka Coonradt, PA-C 03/23/14 0931  Sharlene MottsBenjamin W Paz Fuentes, PA-C 03/23/14 [id number]  Gwyneth SproutWhitney Plunkett, MD 03/23/14 2241

## 2014-03-25 LAB — URINE CULTURE

## 2014-03-27 ENCOUNTER — Telehealth (HOSPITAL_BASED_OUTPATIENT_CLINIC_OR_DEPARTMENT_OTHER): Payer: Self-pay | Admitting: Emergency Medicine

## 2014-03-27 NOTE — Telephone Encounter (Signed)
Post ED Visit - Positive Culture Follow-up  Culture report reviewed by antimicrobial stewardship pharmacist: []  Wes Dulaney, Pharm.D., BCPS [x]  Celedonio MiyamotoJeremy Frens, Pharm.D., BCPS []  Georgina PillionElizabeth Martin, Pharm.D., BCPS []  [place]ugervilleMinh Pham, [address]Ph[address]D., BCPS, AAHIVP []  Estella HuskMichelle Turner, Pharm.D., BCPS, AAHIVP []  Elder CyphersLorie Poole, [address]Pharm[address]D., BCPS  Positive urine culture Proteus Treated with cephalexin, organism sensitive to the same and no further patient follow-up is required at this time.  Berle MullMiller, Stancil Deisher 03/27/2014, 7:16 AM

## 2014-04-16 ENCOUNTER — Ambulatory Visit: Payer: Self-pay | Admitting: Physician Assistant

## 2014-05-10 ENCOUNTER — Other Ambulatory Visit: Payer: Self-pay | Admitting: Physician Assistant

## 2014-05-10 ENCOUNTER — Other Ambulatory Visit: Payer: Self-pay | Admitting: Internal Medicine

## 2014-05-10 DIAGNOSIS — F411 Generalized anxiety disorder: Secondary | ICD-10-CM

## 2014-05-10 DIAGNOSIS — G4700 Insomnia, unspecified: Secondary | ICD-10-CM

## 2014-05-10 MED ORDER — DIAZEPAM 10 MG PO TABS: 10.0000 mg | ORAL_TABLET | Freq: Three times a day (TID) | ORAL | Status: DC

## 2014-05-14 ENCOUNTER — Encounter: Payer: Self-pay | Admitting: Internal Medicine

## 2014-05-14 ENCOUNTER — Ambulatory Visit (INDEPENDENT_AMBULATORY_CARE_PROVIDER_SITE_OTHER): Payer: Commercial Managed Care - HMO | Admitting: Internal Medicine

## 2014-05-14 VITALS — BP 116/78 | HR 84 | Temp 98.0°F | Resp 16 | Ht 66.5 in | Wt 112.0 lb

## 2014-05-14 DIAGNOSIS — R7309 Other abnormal glucose: Secondary | ICD-10-CM

## 2014-05-14 DIAGNOSIS — F32A Depression, unspecified: Secondary | ICD-10-CM

## 2014-05-14 DIAGNOSIS — E559 Vitamin D deficiency, unspecified: Secondary | ICD-10-CM

## 2014-05-14 DIAGNOSIS — F329 Major depressive disorder, single episode, unspecified: Secondary | ICD-10-CM

## 2014-05-14 DIAGNOSIS — E782 Mixed hyperlipidemia: Secondary | ICD-10-CM

## 2014-05-14 DIAGNOSIS — R413 Other amnesia: Secondary | ICD-10-CM

## 2014-05-14 DIAGNOSIS — I10 Essential (primary) hypertension: Secondary | ICD-10-CM

## 2014-05-14 DIAGNOSIS — Z79899 Other long term (current) drug therapy: Secondary | ICD-10-CM

## 2014-05-14 DIAGNOSIS — K6289 Other specified diseases of anus and rectum: Secondary | ICD-10-CM

## 2014-05-14 DIAGNOSIS — R7303 Prediabetes: Secondary | ICD-10-CM

## 2014-05-14 LAB — BASIC METABOLIC PANEL WITH GFR
BUN: 21 mg/dL (ref 6–23)
CO2: 23 mEq/L (ref 19–32)
Calcium: 9.7 mg/dL (ref 8.4–10.5)
Chloride: 104 mEq/L (ref 96–112)
Creat: 1.52 mg/dL — ABNORMAL HIGH (ref 0.50–1.10)
GFR, Est African American: 38 mL/min — ABNORMAL LOW
GFR, Est Non African American: 33 mL/min — ABNORMAL LOW
Glucose, Bld: 89 mg/dL (ref 70–99)
Potassium: 3.7 mEq/L (ref 3.5–5.3)
Sodium: 141 mEq/L (ref 135–145)

## 2014-05-14 LAB — CBC WITH DIFFERENTIAL/PLATELET
BASOS ABS: 0 10*3/uL (ref 0.0–0.1)
Basophils Relative: 0 % (ref 0–1)
EOS PCT: 3 % (ref 0–5)
Eosinophils Absolute: 0.2 10*3/uL (ref 0.0–0.7)
HEMATOCRIT: 42.1 % (ref 36.0–46.0)
Hemoglobin: 14.1 g/dL (ref 12.0–15.0)
Lymphocytes Relative: 26 % (ref 12–46)
Lymphs Abs: 2 10*3/uL (ref 0.7–4.0)
MCH: 30.9 pg (ref 26.0–34.0)
MCHC: 33.5 g/dL (ref 30.0–36.0)
MCV: 92.1 fL (ref 78.0–100.0)
MPV: 11.5 fL (ref 8.6–12.4)
Monocytes Absolute: 0.6 10*3/uL (ref 0.1–1.0)
Monocytes Relative: 8 % (ref 3–12)
Neutro Abs: 4.8 10*3/uL (ref 1.7–7.7)
Neutrophils Relative %: 63 % (ref 43–77)
Platelets: 158 10*3/uL (ref 150–400)
RBC: 4.57 MIL/uL (ref 3.87–5.11)
RDW: 14 % (ref 11.5–15.5)
WBC: 7.6 10*3/uL (ref 4.0–10.5)

## 2014-05-14 LAB — HEPATIC FUNCTION PANEL
ALT: <8 U/L (ref 0–35)
AST: 15 U/L (ref 0–37)
Albumin: 4.2 g/dL (ref 3.5–5.2)
Alkaline Phosphatase: 68 U/L (ref 39–117)
Bilirubin, Direct: 0.2 mg/dL (ref 0.0–0.3)
Indirect Bilirubin: 0.5 mg/dL (ref 0.2–1.2)
Total Bilirubin: 0.7 mg/dL (ref 0.2–1.2)
Total Protein: 7.2 g/dL (ref 6.0–8.3)

## 2014-05-14 LAB — LIPID PANEL
Cholesterol: 215 mg/dL — ABNORMAL HIGH (ref 0–200)
HDL: 42 mg/dL — ABNORMAL LOW (ref >=46)
LDL Cholesterol: 136 mg/dL — ABNORMAL HIGH (ref 0–99)
Total CHOL/HDL Ratio: 5.1 Ratio
Triglycerides: 186 mg/dL — ABNORMAL HIGH (ref <150)
VLDL: 37 mg/dL (ref 0–40)

## 2014-05-14 LAB — MAGNESIUM: Magnesium: 2 mg/dL (ref 1.5–2.5)

## 2014-05-14 LAB — HEMOGLOBIN A1C
HEMOGLOBIN A1C: 5.6 % (ref <5.7)
Mean Plasma Glucose: 114 mg/dL (ref <117)

## 2014-05-14 LAB — VITAMIN B12: Vitamin B-12: 430 pg/mL (ref 211–911)

## 2014-05-14 MED ORDER — NYSTATIN 100000 UNIT/GM EX CREA: 1.0000 "application " | TOPICAL_CREAM | Freq: Two times a day (BID) | CUTANEOUS | Status: DC

## 2014-05-14 MED ORDER — CITALOPRAM HYDROBROMIDE 40 MG PO TABS: 40.0000 mg | ORAL_TABLET | Freq: Every day | ORAL | Status: AC

## 2014-05-14 NOTE — Progress Notes (Signed)
Patient ID: Amy Moss, female   DOB: 1940-07-15, 74 y.o.   MRN: [id number]  Assessment and Plan:  Hypertension:  -Continue medication,  -monitor blood pressure at home.  -Continue DASH diet.   -Reminder to go to the ER if any CP, SOB, nausea, dizziness, severe HA, changes vision/speech, left arm numbness and tingling, and jaw pain.  Cholesterol: -Continue diet and exercise.  -Check cholesterol.   Pre-diabetes: -Continue diet and exercise.  -Check A1C  Vitamin D Def: -check level -continue medications.   Memory Loss -B12 level -increase celexa to 40 mg   Continue diet and meds as discussed. Further disposition pending results of labs.  HPI 74 y.o. female  presents for 3 month follow up with hypertension, hyperlipidemia, prediabetes and vitamin D.   Her blood pressure has been controlled at home, today their BP is BP: 116/78 mmHg.   She does not workout. She denies chest pain, shortness of breath, dizziness.   She is not on cholesterol medication and denies myalgias. Her cholesterol is not at goal. The cholesterol last visit was:   Lab Results  Component Value Date   CHOL 180 01/09/2014   HDL 34* 01/09/2014   LDLCALC 108* 01/09/2014   TRIG 192* 01/09/2014   CHOLHDL 5.3 01/09/2014     She has not been working on diet and exercise for prediabetes, and denies foot ulcerations, hyperglycemia, increased appetite, nausea, paresthesia of the feet, polydipsia, polyuria, visual disturbances, vomiting and weight loss. Last A1C in the office was:  Lab Results  Component Value Date   HGBA1C 5.4 01/09/2014    Patient is on Vitamin D supplement.  Lab Results  Component Value Date   VD25OH 6723* 01/09/2014     Patient does report that her grandson died at age 75 and she reports that she is grieving  Over that.  She also reports that she has a lack of appetite and does not do any activities any more.    Current Medications:  Current Outpatient Prescriptions on File Prior to  Visit  Medication Sig Dispense Refill  . aspirin 325 MG tablet Take 325 mg by mouth.    . citalopram (CELEXA) 20 MG tablet TAKE 1 TABLET BY MOUTH EVERY DAY FOR MOOD AND PAIN 90 tablet 0  . diazepam (VALIUM) 10 MG tablet Take 1 tablet (10 mg total) by mouth 3 (three) times daily. 90 tablet 0  . docusate sodium (COLACE) 100 MG capsule Take 1 capsule (100 mg total) by mouth every 12 (twelve) hours. 30 capsule 0  . enalapril (VASOTEC) 20 MG tablet Take 20 mg by mouth.    Marland Kitchen. ibuprofen (ADVIL,MOTRIN) 200 MG tablet Take 400-800 mg by mouth every 6 (six) hours as needed for moderate pain.    . metoprolol (LOPRESSOR) 50 MG tablet TAKE 1 TABLET BY MOUTH TWICE A DAY FOR BLOOD PRESSURE 180 tablet 1  . minoxidil (LONITEN) 2.5 MG tablet TAKE 1 TABLET BY MOUTH TWICE A DAY 60 tablet 3  . polyethylene glycol powder (GLYCOLAX/MIRALAX) powder Take 17 g by mouth 2 (two) times daily. Until daily soft stools  OTC 119 g 0  . traMADol (ULTRAM) 50 MG tablet Take 1 tablet 4 x day only if needed for severe pain 100 tablet 2   No current facility-administered medications on file prior to visit.    Medical History:  Past Medical History  Diagnosis Date  . Hypertension   . Stroke   . Hypercholesterolemia   . AAA (abdominal aortic aneurysm) without rupture   .  Anemia   . Diverticulosis   . Hemorrhoids   . Lower GI bleed   . Depression   . Brain stem stroke syndrome   . DJD (degenerative joint disease)   . Fatty liver 08/30/09  . COPD (chronic obstructive pulmonary disease)     Allergies: No Known Allergies   Review of Systems:  Review of Systems  Constitutional: Positive for weight loss. Negative for fever, chills and malaise/fatigue.  HENT: Negative for congestion, ear discharge, sore throat and tinnitus.   Respiratory: Negative for cough, shortness of breath and wheezing.   Cardiovascular: Negative for chest pain, palpitations and leg swelling.  Gastrointestinal: Negative for heartburn, nausea,  vomiting, abdominal pain, diarrhea, constipation, blood in stool and melena.  Genitourinary: Negative.   Skin:       She reports that her backside is peeling  Neurological: Negative for dizziness, tingling, sensory change and headaches.  Psychiatric/Behavioral: Positive for depression. Negative for suicidal ideas and memory loss. The patient is nervous/anxious. The patient does not have insomnia.     Family history- Review and unchanged  Social history- Review and unchanged  Physical Exam: BP 116/78 mmHg  Pulse 84  Temp(Src) 98 F (36.7 C) (Temporal)  Resp 16  Ht 5' 6.5" (1.689 m)  Wt 112 lb (50.803 kg)  BMI 17.81 kg/m2  SpO2 92% Wt Readings from Last 3 Encounters:  05/14/14 112 lb (50.803 kg)  01/09/14 132 lb 6.4 oz (60.056 kg)  10/10/13 131 lb (59.421 kg)    General Appearance: Well nourished well developed, in no apparent distress. Eyes: PERRLA, EOMs, conjunctiva no swelling or erythema ENT/Mouth: Ear canals normal without obstruction, swelling, erythma, discharge.  TMs normal bilaterally.  Oropharynx moist, clear, without exudate, or postoropharyngeal swelling. Neck: Supple, thyroid normal,no cervical adenopathy  Respiratory: Respiratory effort normal, Breath sounds clear A&P without rhonchi, wheeze, or rale.  No retractions, no accessory usage. Cardio: RRR with no MRGs. Brisk peripheral pulses without edema.  Abdomen: Soft, + BS,  Non tender, no guarding, rebound, hernias, masses. Musculoskeletal: Full ROM, 5/5 strength, Normal gait Skin: Warm, dry.  There is breakdown, peeling of the skin, and erythema of the sacrum and bilateral buttocks.  Diaper present on exam Neuro: Awake and oriented X 3, Cranial nerves intact. Normal muscle tone, no cerebellar symptoms. Psych: Depressed affect, Insight and Judgment appropriate.    FORCUCCI, Kamyla Olejnik, PA-C 3:35 PM [facility]

## 2014-05-14 NOTE — Patient Instructions (Signed)

## 2014-05-15 LAB — INSULIN, RANDOM: Insulin: 4.2 u[IU]/mL (ref 2.0–19.6)

## 2014-05-17 LAB — VITAMIN D 1,25 DIHYDROXY
Vitamin D 1, 25 (OH)2 Total: 35 pg/mL (ref 18–72)
Vitamin D2 1, 25 (OH)2: <8 pg/mL
Vitamin D3 1, 25 (OH)2: 35 pg/mL

## 2014-05-22 ENCOUNTER — Other Ambulatory Visit: Payer: Self-pay | Admitting: Internal Medicine

## 2014-05-22 ENCOUNTER — Telehealth: Payer: Self-pay | Admitting: *Deleted

## 2014-05-22 NOTE — Telephone Encounter (Signed)
Received a written message from the front staff stating patient's daughter is requesting a Hospice consult.  [facility] [phone] called requesting verbal stating they can eval patient and see if patient is eligible for Hospice at this time.  Patient has AAA and weight loss that family is concerned with.  Per Terri Piedraourtney Forcucci, PA-C verbal given to [facility] for consult.

## 2014-05-25 ENCOUNTER — Other Ambulatory Visit: Payer: Self-pay | Admitting: Internal Medicine

## 2014-05-30 ENCOUNTER — Other Ambulatory Visit: Payer: Self-pay | Admitting: Internal Medicine

## 2014-05-30 DIAGNOSIS — L89159 Pressure ulcer of sacral region, unspecified stage: Secondary | ICD-10-CM

## 2014-06-01 ENCOUNTER — Encounter: Payer: Self-pay | Admitting: Internal Medicine

## 2014-06-03 DIAGNOSIS — R413 Other amnesia: Secondary | ICD-10-CM

## 2014-06-03 DIAGNOSIS — L89152 Pressure ulcer of sacral region, stage 2: Secondary | ICD-10-CM

## 2014-06-03 DIAGNOSIS — R7309 Other abnormal glucose: Secondary | ICD-10-CM

## 2014-06-03 DIAGNOSIS — I10 Essential (primary) hypertension: Secondary | ICD-10-CM

## 2014-06-12 ENCOUNTER — Other Ambulatory Visit: Payer: Self-pay | Admitting: Internal Medicine

## 2014-06-16 ENCOUNTER — Other Ambulatory Visit: Payer: Self-pay | Admitting: Internal Medicine

## 2014-07-24 ENCOUNTER — Other Ambulatory Visit: Payer: Self-pay | Admitting: Internal Medicine

## 2014-07-24 ENCOUNTER — Ambulatory Visit: Payer: Self-pay | Admitting: Internal Medicine

## 2014-08-06 ENCOUNTER — Other Ambulatory Visit: Payer: Self-pay | Admitting: Internal Medicine

## 2014-08-22 ENCOUNTER — Other Ambulatory Visit: Payer: Self-pay | Admitting: Internal Medicine

## 2014-08-27 ENCOUNTER — Ambulatory Visit (INDEPENDENT_AMBULATORY_CARE_PROVIDER_SITE_OTHER): Payer: Commercial Managed Care - HMO | Admitting: Internal Medicine

## 2014-08-27 ENCOUNTER — Encounter: Payer: Self-pay | Admitting: Internal Medicine

## 2014-08-27 VITALS — BP 122/86 | HR 76 | Temp 97.2°F | Resp 16 | Ht 66.5 in | Wt 113.6 lb

## 2014-08-27 DIAGNOSIS — R627 Adult failure to thrive: Secondary | ICD-10-CM

## 2014-08-27 DIAGNOSIS — I10 Essential (primary) hypertension: Secondary | ICD-10-CM | POA: Diagnosis not present

## 2014-08-27 DIAGNOSIS — Z1389 Encounter for screening for other disorder: Secondary | ICD-10-CM | POA: Diagnosis not present

## 2014-08-27 DIAGNOSIS — Z0001 Encounter for general adult medical examination with abnormal findings: Secondary | ICD-10-CM

## 2014-08-27 DIAGNOSIS — J449 Chronic obstructive pulmonary disease, unspecified: Secondary | ICD-10-CM | POA: Diagnosis not present

## 2014-08-27 DIAGNOSIS — R634 Abnormal weight loss: Secondary | ICD-10-CM

## 2014-08-27 DIAGNOSIS — I712 Thoracic aortic aneurysm, without rupture, unspecified: Secondary | ICD-10-CM

## 2014-08-27 DIAGNOSIS — N3000 Acute cystitis without hematuria: Secondary | ICD-10-CM | POA: Diagnosis not present

## 2014-08-27 DIAGNOSIS — E782 Mixed hyperlipidemia: Secondary | ICD-10-CM | POA: Diagnosis not present

## 2014-08-27 DIAGNOSIS — R6889 Other general symptoms and signs: Secondary | ICD-10-CM

## 2014-08-27 DIAGNOSIS — Z79899 Other long term (current) drug therapy: Secondary | ICD-10-CM | POA: Diagnosis not present

## 2014-08-27 DIAGNOSIS — E559 Vitamin D deficiency, unspecified: Secondary | ICD-10-CM | POA: Diagnosis not present

## 2014-08-27 DIAGNOSIS — R7309 Other abnormal glucose: Secondary | ICD-10-CM | POA: Diagnosis not present

## 2014-08-27 DIAGNOSIS — R296 Repeated falls: Secondary | ICD-10-CM

## 2014-08-27 DIAGNOSIS — R7303 Prediabetes: Secondary | ICD-10-CM

## 2014-08-27 DIAGNOSIS — Z1331 Encounter for screening for depression: Secondary | ICD-10-CM

## 2014-08-27 DIAGNOSIS — Z681 Body mass index (BMI) 19 or less, adult: Secondary | ICD-10-CM | POA: Diagnosis not present

## 2014-08-27 DIAGNOSIS — Z9181 History of falling: Secondary | ICD-10-CM

## 2014-08-27 LAB — CBC WITH DIFFERENTIAL/PLATELET
Basophils Absolute: 0 10*3/uL (ref 0.0–0.1)
Basophils Relative: 0 % (ref 0–1)
EOS ABS: 0.4 10*3/uL (ref 0.0–0.7)
Eosinophils Relative: 5 % (ref 0–5)
HCT: 37.2 % (ref 36.0–46.0)
Hemoglobin: 12.8 g/dL (ref 12.0–15.0)
LYMPHS ABS: 1.7 10*3/uL (ref 0.7–4.0)
Lymphocytes Relative: 22 % (ref 12–46)
MCH: 31.1 pg (ref 26.0–34.0)
MCHC: 34.4 g/dL (ref 30.0–36.0)
MCV: 90.5 fL (ref 78.0–100.0)
MONO ABS: 0.7 10*3/uL (ref 0.1–1.0)
MONOS PCT: 9 % (ref 3–12)
MPV: 10.7 fL (ref 8.6–12.4)
NEUTROS PCT: 64 % (ref 43–77)
Neutro Abs: 5 10*3/uL (ref 1.7–7.7)
Platelets: 143 10*3/uL — ABNORMAL LOW (ref 150–400)
RBC: 4.11 MIL/uL (ref 3.87–5.11)
RDW: 13.7 % (ref 11.5–15.5)
WBC: 7.8 10*3/uL (ref 4.0–10.5)

## 2014-08-27 MED ORDER — MIRTAZAPINE 30 MG PO TABS: ORAL_TABLET | ORAL | Status: AC

## 2014-08-27 NOTE — Patient Instructions (Signed)

## 2014-08-27 NOTE — Progress Notes (Signed)
Patient ID: Amy Moss, female   DOB: October 08, 1940, 74 y.o.   MRN: [id number]  MEDICARE ANNUAL WELLNESS VISIT AND OV  Assessment:   1. Essential hypertension  - TSH  2. Mixed hyperlipidemia   3. Prediabetes  - Hemoglobin A1c - Insulin, random  4. Vitamin D deficiency  - Vit D  25 hydroxy   5. Chronic obstructive pulmonary disease   6. Acute cystitis without hematuria  - Urine Microscopic - Urine culture  7. Body mass index (BMI) of 19 or less in adult   8. Abnormal weight loss  - Sedimentation rate - C-reactive protein  - Ambulatory referral to Home Health  - mirtazapine (REMERON) 30 MG tablet; Take 1/2 to 1 tablet at bedtime as directed for appetite  Dispense: 30 tablet; Refill: 99  9. Encounter for general adult medical examination with abnormal findings   10. Adult failure to thrive syndrome  - Ambulatory referral to Home Health  - mirtazapine (REMERON) 30 MG tablet; Take 1/2 to 1 tablet at bedtime as directed for appetite  Dispense: 30 tablet; Refill: 99  11. Depression screen   12. Medication management  - CBC with Differential/Platelet - BASIC METABOLIC PANEL WITH GFR - Hepatic function panel - Magnesium   Plan:   During the course of the visit the patient was educated and counseled about appropriate screening and preventive services including:    Pneumococcal vaccine   Influenza vaccine  Td vaccine  Screening electrocardiogram  Bone densitometry screening  Colorectal cancer screening  Diabetes screening  Glaucoma screening  Nutrition counseling   Advanced directives: requested  Screening recommendations, referrals: Vaccinations:  Immunization History  Administered Date(s) Administered  . DTaP 07/23/2004  Influenza vaccine declined Pneumococcal vaccine declined Prevnar vaccine declined Shingles vaccine declined Hep B vaccine not indicated  Nutrition assessed and recommended  Colonoscopy 2004 Recommended yearly  ophthalmology/optometry visit for glaucoma screening and checkup Recommended yearly dental visit for hygiene and checkup Advanced directives - yes  Conditions/risks identified: BMI: Discussed weight loss, diet, and increase physical activity.  Increase physical activity: AHA recommends 150 minutes of physical activity a week.  Medications reviewed No Diabetes has been diagnosed, ACE/ARB therapy: Not indicated. Urinary Incontinence is an issue: discussed non pharmacology and pharmacology options.  Fall risk: moderate- discussed PT, home fall assessment, medications.   Subjective:      Amy Moss  presents for [employer] Visit and OV.  No prior medicare wellness visit is known.  This very nice 74 y.o. WWF presents also  for  follow up with Hypertension, Hyperlipidemia, Pre-Diabetes and Vitamin D Deficiency.      Patient is treated for HTN since 1990  & BP has been controlled at home. Today's BP: 122/86 mmHg. Patient has had no complaints of any cardiac type chest pain, palpitations, dyspnea/orthopnea/PND, dizziness, claudication, or dependent edema.     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 Patient is considered an extremely poor operative risk for any major surgeries.      Hyperlipidemia is controlled with diet & meds. Patient denies myalgias or other med SE's. Last Lipids were no at goal Cholesterol 215*; HDL 42*; LDL 136*; Triglycerides 186 on 05/14/2014.      The patient has had about a 25 # unexplained weight loss over the last 2 years and  about 15# over the last year, but has held her weight stable over the last 6 months.  Her daughter Amy Moss with her today says she eats well when Amy Moss stays with her and suspects when she stays with her sister now completing chemoradiation for  lung Cancer that meals are not being prepared. The patient has been screened for PreDiabetes, Thyroid Dz, Malignancy, etc and has had no symptoms of reactive hypoglycemia, diabetic polys, paresthesias or visual blurring. Further she's had no suspect GI, Resp. Or GU sx's. Last A1c was  5.6% on  05/14/2014.     Further, the patient also has history of Vitamin D Deficiency of 17 in 2013 and supplements vitamin D without any suspected side-effects. Last vitamin D was  23 on 01/09/2014.      Names of Other Physician/Practitioners you currently use: 1. [facility] here for primary care 2. Dr Manning Charity, eye doctor, last visit 2016 3. "[facility] and also Dr Amie Critchley, dentist, last visit 2015  Patient Care Team: Lucky Cowboy, MD as PCP - General (Internal Medicine)  Medication Review: Medication Sig  . baclofen (LIORESAL) 10 MG tablet Take 10 mg by mouth 3 (three) times daily.  . citalopram (CELEXA) 40 MG tablet Take 1 tablet (40 mg total) by mouth daily.  . diazepam (VALIUM) 10 MG tablet TAKE 1 TABLET BY MOUTH 3 TIMES A DAY  . enalapril (VASOTEC) 20 MG tablet Take 20 mg by mouth.  Marland Kitchen ibuprofen (ADVIL,MOTRIN) 200 MG tablet Take 400-800 mg by mouth every 6 (six) hours as needed for moderate pain.  . metoprolol (LOPRESSOR) 50 MG tablet TAKE 1 TABLET BY MOUTH TWICE A DAY FOR BLOOD PRESSURE  . minoxidil (LONITEN) 2.5 MG tablet TAKE 1 TABLET BY MOUTH TWICE A DAY  . nystatin cream (MYCOSTATIN) APPLY TO AFFECTED AREA TWICE A DAY  . traMADol (ULTRAM) 50 MG tablet Take 1 tablet 4 x day only if needed for severe pain    No Known Allergies  Current Problems (verified) Patient Active Problem List   Diagnosis Date Noted  . Medication management 07/10/2013  . Mixed hyperlipidemia 01/05/2013  . Prediabetes 01/05/2013  . Vitamin D deficiency 01/05/2013  . Hypertension   . Stroke   . AAA (abdominal aortic aneurysm) without rupture   . Depression   .  COPD (chronic obstructive pulmonary disease)   . Renal artery stenosis 12/30/2011  . Fatty liver 08/30/2009    Screening Tests Health Maintenance  Topic Date Due  . TETANUS/TDAP  03/17/1958  . COLONOSCOPY  03/17/1989  . ZOSTAVAX  03/18/1999  . PNA vac Low Risk Adult (1 of 2 - PCV13) 03/17/2004  . INFLUENZA VACCINE  08/20/2014  . DEXA SCAN  Completed    Immunization History  Administered Date(s) Administered  . DTaP 07/23/2004    Preventative care: Last colonoscopy: 2004  Past Medical History  Diagnosis Date  . Hypertension   . Stroke   . Hypercholesterolemia   . AAA (abdominal aortic aneurysm) without rupture   . Anemia   . Diverticulosis   . Hemorrhoids   . Lower GI bleed   . Depression   . Brain stem stroke syndrome   . DJD (degenerative joint disease)   . Fatty liver 08/30/09  . COPD (chronic obstructive pulmonary disease)    Past Surgical History  Procedure Laterality Date  . Laminectomy      x 4  . Knee surgery    . Abdominal hysterectomy    . Eye  surgery    . Cataract extraction, bilateral    . Shoulder surgery      Risk Factors: Tobacco History  Substance Use Topics  . Smoking status: Current Every Day Smoker -- 1.00 packs/day    Types: Cigarettes  . Smokeless tobacco: Never Used     Comment: patient states she does not smoke daily, only when she is upset.  . Alcohol Use: No   She does smoke. Are there smokers in your home ?  Yes Daughter that she lives with still smokes despite Lung Cancer.  Alcohol Current alcohol use: none  Caffeine Current caffeine use: coffee 0-1 cup /day, tea 0-1 glass /day and caffeinated soft drinks 0-1 can /day  Exercise Current exercise: none  Nutrition/Diet Current diet: in general, a "healthy" diet    Cardiac risk factors: advanced age (older than 57 for men, 42 for women), dyslipidemia, hypertension, sedentary lifestyle and smoking/ tobacco exposure.  Depression Screen (Note: if answer to either of the  following is "Yes", a more complete depression screening is indicated)   Q1: Over the past two weeks, have you felt down, depressed or hopeless? No  Q2: Over the past two weeks, have you felt little interest or pleasure in doing things? No  Have you lost interest or pleasure in daily life? No  Do you often feel hopeless? No  Do you cry easily over simple problems? No  Activities of Daily Living In your present state of health, do you have any difficulty performing the following activities?:  Driving? No Managing money?  No Feeding yourself? No Getting from bed to chair? No Climbing a flight of stairs? No Preparing food and eating?: No Bathing or showering? No Getting dressed: No Getting to the toilet? No Using the toilet:No Moving around from place to place: No In the past year have you fallen or had a near fall?: tripped & lost balance w/o major injury   Are you sexually active?  No  Do you have more than one partner?  No  Vision Difficulties: No  Hearing Difficulties: No Do you often ask people to speak up or repeat themselves? No Do you experience ringing or noises in your ears? No Do you have difficulty understanding soft or whispered voices? Sometimes.  Cognition  Do you feel that you have a problem with memory?No  Do you often misplace items? No  Do you feel safe at home?  Yes  Advanced directives Does patient have a Health Care Power of Attorney? Yes Does patient have a Living Will? Yes  ROS: Constitutional: Denies fever, chills, weight loss/gain, headaches, insomnia, fatigue, night sweats, and change in appetite. Eyes: Denies redness, blurred vision, diplopia, discharge, itchy, watery eyes.  ENT: Denies discharge, congestion, post nasal drip, epistaxis, sore throat, earache, hearing loss, dental pain, Tinnitus, Vertigo, Sinus pain, snoring.  Cardio: Denies chest pain, palpitations, irregular heartbeat, syncope, dyspnea, diaphoresis, orthopnea, PND, claudication,  edema Respiratory: denies cough, dyspnea, DOE, pleurisy, hoarseness, laryngitis, wheezing.  Gastrointestinal: Denies dysphagia, heartburn, reflux, water brash, pain, cramps, nausea, vomiting, bloating, diarrhea, constipation, hematemesis, melena, hematochezia, jaundice, hemorrhoids Genitourinary: Denies dysuria, frequency, urgency, nocturia, hesitancy, discharge, hematuria, flank pain Breast: Breast lumps, nipple discharge, bleeding.  Musculoskeletal: Denies arthralgia, myalgia, stiffness, Jt. Swelling, pain, limp, and strain/sprain. Denies falls. Skin: Denies puritis, rash, hives, warts, acne, eczema, changing in skin lesion Neuro: No weakness, tremor, incoordination, spasms, paresthesia, pain Psychiatric: Denies confusion, memory loss, sensory loss. Denies Depression. Endocrine: Denies change in weight, skin, hair change, nocturia, and paresthesia,  diabetic polys, visual blurring, hyper / hypo glycemic episodes.  Heme/Lymph: No excessive bleeding, bruising, enlarged lymph nodes  Objective:     BP 122/86   Pulse 76  Temp 97.2 F   Resp 16  Ht 5' 6.5"   Wt 113 lb 9.6 oz    BMI 18.06   General Appearance: Malnourished & emaciated appearing elderly white female and in no apparent distress. Eyes: PERRLA, EOMs, conjunctiva no swelling or erythema, normal fundi and vessels. Sinuses: No frontal/maxillary tenderness ENT/Mouth: EACs patent / TMs  nl. Nares clear without erythema, swelling, mucoid exudates. Oral hygiene is good. No erythema, swelling, or exudate. Tongue normal, non-obstructing. Tonsils not swollen or erythematous. Hearing normal.  Neck: Supple, thyroid normal. No bruits, nodes or JVD. Respiratory: Respiratory effort normal.  BS equal and clear bilateral without rales, rhonci, wheezing or stridor. Cardio: Heart sounds are normal with regular rate and rhythm and no murmurs, rubs or gallops. Peripheral pulses are normal and equal bilaterally without edema. No aortic or femoral  bruits. Chest: symmetric with normal excursions and percussion. Breasts: Symmetric, without lumps, nipple discharge, retractions, or fibrocystic changes.  Abdomen: Flat, scaphoid with nl bowel sounds. Nontender, no guarding, rebound, hernias, masses, or organomegaly.  Lymphatics: Non tender without lymphadenopathy.  Musculoskeletal: Generalized decrease in muscle power , tone and bulk and patient is in W/C due to severe weakness and unstable gait.  Skin: Warm and dry without rashes, lesions, cyanosis, clubbing or  ecchymosis.  Neuro: Cranial nerves intact, reflexes equal bilaterally. Normal muscle tone, no cerebellar symptoms. Sensation intact.  Pysch: Alert and oriented X 3, normal affect, Insight and Judgment appropriate.   Cognitive Testing  Alert? Yes  Normal Appearance?Yes  Oriented to person? Yes  Place? Yes   Time? Yes  Recall of three objects?  Yes  Can perform simple calculations? Yes  Displays appropriate judgment? Yes  Can read the correct time from a watch/clock?Yes  Medicare Attestation I have personally reviewed: The patient's medical and social history Their use of alcohol, tobacco or illicit drugs Their current medications and supplements The patient's functional ability including ADLs,fall risks, home safety risks, cognitive, and hearing and visual impairment Diet and physical activities Evidence for depression or mood disorders  The patient's weight, height, BMI, and visual acuity have been recorded in the chart.  I have made referrals, counseling, and provided education to the patient based on review of the above and I have provided the patient with a written personalized care plan for preventive services.  Over 40 minutes of exam, counseling, chart review was performed.  Gordana Kewley DAVID, MD   08/27/2014

## 2014-08-28 LAB — C-REACTIVE PROTEIN: CRP: 1 mg/dL — ABNORMAL HIGH

## 2014-08-28 LAB — INSULIN, RANDOM: INSULIN: 6.4 u[IU]/mL (ref 2.0–19.6)

## 2014-08-28 LAB — BASIC METABOLIC PANEL WITH GFR
BUN: 28 mg/dL — ABNORMAL HIGH (ref 7–25)
CALCIUM: 9.4 mg/dL (ref 8.6–10.4)
CHLORIDE: 104 mmol/L (ref 98–110)
CO2: 23 mmol/L (ref 20–31)
Creat: 1.63 mg/dL — ABNORMAL HIGH (ref 0.60–0.93)
GFR, EST AFRICAN AMERICAN: 35 mL/min — AB (ref >=60)
GFR, EST NON AFRICAN AMERICAN: 31 mL/min — AB (ref >=60)
GLUCOSE: 97 mg/dL (ref 65–99)
Potassium: 3.9 mmol/L (ref 3.5–5.3)
Sodium: 139 mmol/L (ref 135–146)

## 2014-08-28 LAB — HEPATIC FUNCTION PANEL
ALBUMIN: 4.1 g/dL (ref 3.6–5.1)
ALT: 4 U/L — ABNORMAL LOW (ref 6–29)
AST: 15 U/L (ref 10–35)
Alkaline Phosphatase: 69 U/L (ref 33–130)
BILIRUBIN TOTAL: 0.6 mg/dL (ref 0.2–1.2)
Bilirubin, Direct: 0.1 mg/dL (ref <=0.2)
Indirect Bilirubin: 0.5 mg/dL (ref 0.2–1.2)
TOTAL PROTEIN: 7 g/dL (ref 6.1–8.1)

## 2014-08-28 LAB — HEMOGLOBIN A1C
Hgb A1c MFr Bld: 5.5 % (ref <5.7)
MEAN PLASMA GLUCOSE: 111 mg/dL (ref <117)

## 2014-08-28 LAB — VITAMIN D 25 HYDROXY (VIT D DEFICIENCY, FRACTURES): Vit D, 25-Hydroxy: 17 ng/mL — ABNORMAL LOW (ref 30–100)

## 2014-08-28 LAB — TSH: TSH: 1.424 u[IU]/mL (ref 0.350–4.500)

## 2014-08-28 LAB — SEDIMENTATION RATE: Sed Rate: 1 mm/hr (ref 0–30)

## 2014-08-28 LAB — MAGNESIUM: Magnesium: 1.9 mg/dL (ref 1.5–2.5)

## 2014-09-20 DEATH — deceased

## 2014-10-02 ENCOUNTER — Ambulatory Visit: Payer: Self-pay | Admitting: Internal Medicine

## 2015-01-22 ENCOUNTER — Encounter: Payer: Self-pay | Admitting: Internal Medicine

## 2015-08-12 ENCOUNTER — Telehealth: Payer: Self-pay | Admitting: *Deleted

## 2015-08-12 NOTE — Telephone Encounter (Signed)
Opened in error
